# Patient Record
Sex: Female | Born: 1940 | Race: White | Hispanic: No | Smoking: Former smoker
Health system: Southern US, Community
[De-identification: ages and names within clinical notes are randomized; demographics above are authoritative.]

## PROBLEM LIST (undated history)

## (undated) DIAGNOSIS — G35 Multiple sclerosis: Secondary | ICD-10-CM

## (undated) DIAGNOSIS — E039 Hypothyroidism, unspecified: Secondary | ICD-10-CM

## (undated) DIAGNOSIS — I10 Essential (primary) hypertension: Secondary | ICD-10-CM

## (undated) DIAGNOSIS — M1990 Unspecified osteoarthritis, unspecified site: Secondary | ICD-10-CM

## (undated) DIAGNOSIS — N189 Chronic kidney disease, unspecified: Secondary | ICD-10-CM

## (undated) DIAGNOSIS — E119 Type 2 diabetes mellitus without complications: Secondary | ICD-10-CM

## (undated) HISTORY — PX: BACK SURGERY: SHX140

## (undated) HISTORY — PX: COLOSTOMY: SHX63

## (undated) HISTORY — PX: BREAST EXCISIONAL BIOPSY: SUR124

## (undated) HISTORY — PX: CHOLECYSTECTOMY: SHX55

## (undated) HISTORY — PX: ABDOMINAL HYSTERECTOMY: SHX81

---

## 2017-02-17 ENCOUNTER — Emergency Department
Admission: EM | Admit: 2017-02-17 | Discharge: 2017-02-17 | Disposition: A | Discharge status: Home / Self Care | Payer: Medicare Other | Attending: Emergency Medicine | Admitting: Emergency Medicine

## 2017-02-17 DIAGNOSIS — Z87891 Personal history of nicotine dependence: Secondary | ICD-10-CM | POA: Insufficient documentation

## 2017-02-17 DIAGNOSIS — I10 Essential (primary) hypertension: Secondary | ICD-10-CM | POA: Diagnosis not present

## 2017-02-17 DIAGNOSIS — N952 Postmenopausal atrophic vaginitis: Secondary | ICD-10-CM | POA: Diagnosis not present

## 2017-02-17 DIAGNOSIS — N939 Abnormal uterine and vaginal bleeding, unspecified: Secondary | ICD-10-CM | POA: Diagnosis present

## 2017-02-17 DIAGNOSIS — E119 Type 2 diabetes mellitus without complications: Secondary | ICD-10-CM | POA: Insufficient documentation

## 2017-02-17 HISTORY — DX: Type 2 diabetes mellitus without complications: E11.9

## 2017-02-17 HISTORY — DX: Essential (primary) hypertension: I10

## 2017-02-17 LAB — BASIC METABOLIC PANEL
ANION GAP: 9 (ref 5–15)
BUN: 31 mg/dL — ABNORMAL HIGH (ref 6–20)
CO2: 28 mmol/L (ref 22–32)
Calcium: 10.1 mg/dL (ref 8.9–10.3)
Chloride: 101 mmol/L (ref 101–111)
Creatinine, Ser: 1.58 mg/dL — ABNORMAL HIGH (ref 0.44–1.00)
GFR calc non Af Amer: 31 mL/min — ABNORMAL LOW (ref >60)
GFR, EST AFRICAN AMERICAN: 36 mL/min — AB (ref >60)
Glucose, Bld: 163 mg/dL — ABNORMAL HIGH (ref 65–99)
POTASSIUM: 4.4 mmol/L (ref 3.5–5.1)
SODIUM: 138 mmol/L (ref 135–145)

## 2017-02-17 LAB — CBC
HCT: 36.5 % (ref 35.0–47.0)
HEMOGLOBIN: 12.8 g/dL (ref 12.0–16.0)
MCH: 30.5 pg (ref 26.0–34.0)
MCHC: 35.1 g/dL (ref 32.0–36.0)
MCV: 86.8 fL (ref 80.0–100.0)
PLATELETS: 189 10*3/uL (ref 150–440)
RBC: 4.21 MIL/uL (ref 3.80–5.20)
RDW: 12.3 % (ref 11.5–14.5)
WBC: 10.4 10*3/uL (ref 3.6–11.0)

## 2017-02-17 LAB — TYPE AND SCREEN
ABO/RH(D): B POS
ANTIBODY SCREEN: NEGATIVE

## 2017-02-17 LAB — LIPASE, BLOOD: LIPASE: 55 U/L — AB (ref 11–51)

## 2017-02-17 NOTE — Discharge Instructions (Signed)
Please keep your follow-up appointment with your primary care physician on October 1 as scheduled and return to the emergency department for any concerns.  It was a pleasure to take care of you today, and thank you for coming to our emergency department.  If you have any questions or concerns before leaving please ask the nurse to grab me and I'm more than happy to go through your aftercare instructions again.  If you were prescribed any opioid pain medication today such as Norco, Vicodin, Percocet, morphine, hydrocodone, or oxycodone please make sure you do not drive when you are taking this medication as it can alter your ability to drive safely.  If you have any concerns once you are home that you are not improving or are in fact getting worse before you can make it to your follow-up appointment, please do not hesitate to call 911 and come back for further evaluation.  Merrily BrittleNeil Cheney Ewart, MD  Results for orders placed or performed during the hospital encounter of 02/17/17  CBC  Result Value Ref Range   WBC 10.4 3.6 - 11.0 K/uL   RBC 4.21 3.80 - 5.20 MIL/uL   Hemoglobin 12.8 12.0 - 16.0 g/dL   HCT 16.136.5 09.635.0 - 04.547.0 %   MCV 86.8 80.0 - 100.0 fL   MCH 30.5 26.0 - 34.0 pg   MCHC 35.1 32.0 - 36.0 g/dL   RDW 40.912.3 81.111.5 - 91.414.5 %   Platelets 189 150 - 440 K/uL  Basic metabolic panel  Result Value Ref Range   Sodium 138 135 - 145 mmol/L   Potassium 4.4 3.5 - 5.1 mmol/L   Chloride 101 101 - 111 mmol/L   CO2 28 22 - 32 mmol/L   Glucose, Bld 163 (H) 65 - 99 mg/dL   BUN 31 (H) 6 - 20 mg/dL   Creatinine, Ser 7.821.58 (H) 0.44 - 1.00 mg/dL   Calcium 95.610.1 8.9 - 21.310.3 mg/dL   GFR calc non Af Amer 31 (L) >60 mL/min   GFR calc Af Amer 36 (L) >60 mL/min   Anion gap 9 5 - 15  Lipase, blood  Result Value Ref Range   Lipase 55 (H) 11 - 51 U/L  Type and screen [facility]  Result Value Ref Range   ABO/RH(D) B POS    Antibody Screen NEG    Sample Expiration 02/20/2017

## 2017-02-17 NOTE — ED Notes (Signed)
Assessment performed by MD with RN at bedside. No bleeding visualized.

## 2017-02-17 NOTE — ED Provider Notes (Signed)
[facility] Emergency Department Provider Note  ____________________________________________   First MD Initiated Contact with Patient 02/17/17 1931     (approximate)  I have reviewed the triage vital signs and the nursing notes.   HISTORY  Chief Complaint Vaginal Bleeding    HPI Brianna Mora is a 76 y.o. female who comes to the emergency department with 1-2 days of intermittent vaginal bleeding. She had another episode several weeks ago that resolved. She noticed it when she was on the bathroom wiping her vagina after urinating. The toilet paper had blood-tinged and a small amount of clot. No hematochezia. She has a remote abdominal hysterectomy in 1987. No fevers or chills. She has not seen an OB gynecologist or had a pelvic exam in many years. Nothing in particular seems to make the spotting come on or go.She has not had sexual intercourse in 15 years.   Past Medical History:  Diagnosis Date  . Diabetes mellitus without complication (HCC)   . Hypertension     There are no active problems to display for this patient.   Past Surgical History:  Procedure Laterality Date  . ABDOMINAL HYSTERECTOMY    . BACK SURGERY    . CHOLECYSTECTOMY    . COLOSTOMY      Prior to Admission medications   Not on File    Allergies Penicillins  History reviewed. No pertinent family history.  Social History Social History  Substance Use Topics  . Smoking status: Former Games developer  . Smokeless tobacco: Not on file  . Alcohol use No    Review of Systems Constitutional: No fever/chills Eyes: No visual changes. ENT: No sore throat. Cardiovascular: Denies chest pain. Respiratory: Denies shortness of breath. Gastrointestinal: No abdominal pain.  No nausea, no vomiting.  No diarrhea.  No constipation. Genitourinary: Negative for dysuria. Musculoskeletal: Negative for back pain. Skin: Negative for rash. Neurological: Negative for headaches, focal weakness or  numbness.   ____________________________________________   PHYSICAL EXAM:  VITAL SIGNS: ED Triage Vitals  Enc Vitals Group     BP 02/17/17 1820 (!) 149/67     Pulse Rate 02/17/17 1820 80     Resp 02/17/17 1820 18     Temp 02/17/17 1820 98.5 F (36.9 C)     Temp Source 02/17/17 1820 Oral     SpO2 02/17/17 1820 99 %     Weight 02/17/17 1821 183 lb (83 kg)     Height 02/17/17 1821  (1.676 m)     Head Circumference --      Peak Flow --      Pain Score 02/17/17 1820 0     Pain Loc --      Pain Edu? --      Excl. in GC? --     Constitutional: Alert and oriented 4 pleasant cooperative speaks in full clear sentences no diaphoresis Eyes: PERRL EOMI. Head: Atraumatic. Nose: No congestion/rhinnorhea. Mouth/Throat: No trismus Neck: No stridor.   Cardiovascular: Normal rate, regular rhythm. Grossly normal heart sounds.  Good peripheral circulation. Respiratory: Normal respiratory effort.  No retractions. Lungs CTAB and moving good air Gastrointestinal: Soft nontender pelvic exam chaperoned by female nurse Clinton Sawyer: normal external exam vaginal atrophy noted in the vault with mucosal irritation and slight bleeding. Musculoskeletal: No lower extremity edema   Neurologic:  Normal speech and language. No gross focal neurologic deficits are appreciated. Skin:  Skin is warm, dry and intact. No rash noted. Psychiatric: Mood and affect are normal. Speech and behavior are  normal.    ____________________________________________   DIFFERENTIAL includes but not limited to  Vaginal laceration, cervical cancer, vaginal atrophy, hematochezia ____________________________________________   LABS (all labs ordered are listed, but only abnormal results are displayed)  Labs Reviewed  BASIC METABOLIC PANEL - Abnormal; Notable for the following:       Result Value   Glucose, Bld 163 (*)    BUN 31 (*)    Creatinine, Ser 1.58 (*)    GFR calc non Af Amer 31 (*)    GFR calc Af Amer 36 (*)      All other components within normal limits  LIPASE, BLOOD - Abnormal; Notable for the following:    Lipase 55 (*)    All other components within normal limits  CBC  TYPE AND SCREEN    Not anemic __________________________________________  EKG   ____________________________________________  RADIOLOGY   ____________________________________________   PROCEDURES  Procedure(s) performed: no  Procedures  Critical Care performed: no  Observation: no ____________________________________________   INITIAL IMPRESSION / ASSESSMENT AND PLAN / ED COURSE  Pertinent labs & imaging results that were available during my care of the patient were reviewed by me and considered in my medical decision making (see chart for details).  The patient is hemodynamically stable and well appearing. Of most concern with postmenopausal vaginal bleeding cervical cancer, however I did not visualize any masses. I did see several abrasions and tears within her vaginal vault although she is not actively bleeding. This may very well be secondary to vaginal atrophy and a postmenopausal state. Regardless I have recommended she follow up with her primary care physician for reevaluation and further evaluation of her cervix. She is discharged home in improved condition.      ____________________________________________   FINAL CLINICAL IMPRESSION(S) / ED DIAGNOSES  Final diagnoses:  Vaginal atrophy      NEW MEDICATIONS STARTED DURING THIS VISIT:  There are no discharge medications for this patient.    Note:  This document was prepared using Dragon voice recognition software and may include unintentional dictation errors.     Merrily Brittleifenbark, Jorgia Manthei, MD 02/18/17 2209

## 2017-02-17 NOTE — ED Triage Notes (Signed)
Pt arrives with c/o of vaginal bleeding. Had hysterectomy in 1987. States had vaginal bleeding while travelling in [place] a few weeks ago, went away, states came back today. States bright red with clots. Alert, oriented, ambulatory. Does NOT appear pale.

## 2017-04-16 ENCOUNTER — Emergency Department
Admission: EM | Admit: 2017-04-16 | Discharge: 2017-04-16 | Disposition: A | Discharge status: Home / Self Care | Payer: Medicare Other | Attending: Emergency Medicine | Admitting: Emergency Medicine

## 2017-04-16 ENCOUNTER — Encounter: Payer: Self-pay | Admitting: *Deleted

## 2017-04-16 DIAGNOSIS — I10 Essential (primary) hypertension: Secondary | ICD-10-CM | POA: Insufficient documentation

## 2017-04-16 DIAGNOSIS — Z87891 Personal history of nicotine dependence: Secondary | ICD-10-CM | POA: Diagnosis not present

## 2017-04-16 DIAGNOSIS — E119 Type 2 diabetes mellitus without complications: Secondary | ICD-10-CM | POA: Diagnosis not present

## 2017-04-16 DIAGNOSIS — L03031 Cellulitis of right toe: Secondary | ICD-10-CM | POA: Diagnosis not present

## 2017-04-16 DIAGNOSIS — M79671 Pain in right foot: Secondary | ICD-10-CM | POA: Diagnosis present

## 2017-04-16 LAB — CBC
HEMATOCRIT: 38.9 % (ref 35.0–47.0)
HEMOGLOBIN: 13.2 g/dL (ref 12.0–16.0)
MCH: 30.2 pg (ref 26.0–34.0)
MCHC: 33.9 g/dL (ref 32.0–36.0)
MCV: 89 fL (ref 80.0–100.0)
Platelets: 172 10*3/uL (ref 150–440)
RBC: 4.37 MIL/uL (ref 3.80–5.20)
RDW: 12.7 % (ref 11.5–14.5)
WBC: 9 10*3/uL (ref 3.6–11.0)

## 2017-04-16 LAB — COMPREHENSIVE METABOLIC PANEL
ALBUMIN: 4.2 g/dL (ref 3.5–5.0)
ALK PHOS: 75 U/L (ref 38–126)
ALT: 15 U/L (ref 14–54)
AST: 24 U/L (ref 15–41)
Anion gap: 10 (ref 5–15)
BILIRUBIN TOTAL: 0.8 mg/dL (ref 0.3–1.2)
BUN: 31 mg/dL — AB (ref 6–20)
CALCIUM: 9.5 mg/dL (ref 8.9–10.3)
CO2: 26 mmol/L (ref 22–32)
Chloride: 97 mmol/L — ABNORMAL LOW (ref 101–111)
Creatinine, Ser: 1.38 mg/dL — ABNORMAL HIGH (ref 0.44–1.00)
GFR calc Af Amer: 42 mL/min — ABNORMAL LOW (ref >60)
GFR calc non Af Amer: 36 mL/min — ABNORMAL LOW (ref >60)
GLUCOSE: 215 mg/dL — AB (ref 65–99)
Potassium: 4.1 mmol/L (ref 3.5–5.1)
SODIUM: 133 mmol/L — AB (ref 135–145)
TOTAL PROTEIN: 7.5 g/dL (ref 6.5–8.1)

## 2017-04-16 MED ORDER — CLINDAMYCIN HCL 300 MG PO CAPS: 300.0000 mg | ORAL_CAPSULE | Freq: Four times a day (QID) | ORAL | 0 refills | Status: DC

## 2017-04-16 MED ORDER — HYDROCODONE-ACETAMINOPHEN 5-325 MG PO TABS: 1.0000 | ORAL_TABLET | Freq: Four times a day (QID) | ORAL | 0 refills | Status: DC | PRN

## 2017-04-16 MED ORDER — CLINDAMYCIN PHOSPHATE 600 MG/4ML IJ SOLN
600.0000 mg | Freq: Once | INTRAMUSCULAR | Status: AC
Start: 2017-04-16 — End: 2017-04-16
  Administered 2017-04-16: 600 mg via INTRAMUSCULAR
  Filled 2017-04-16: qty 4

## 2017-04-16 MED ORDER — OXYCODONE-ACETAMINOPHEN 5-325 MG PO TABS
1.0000 | ORAL_TABLET | Freq: Once | ORAL | Status: AC
  Administered 2017-04-16: 1 via ORAL
  Filled 2017-04-16: qty 1

## 2017-04-16 MED ORDER — CLINDAMYCIN HCL 300 MG PO CAPS: 300.0000 mg | ORAL_CAPSULE | Freq: Four times a day (QID) | ORAL | 0 refills | Status: AC

## 2017-04-16 NOTE — ED Notes (Signed)
Pt right big toe starrted about 2 weeks ago and last night was intolerable. Pt could not sleep with pain in the toe, to touch anything, she was unable to wear shoe today. "discoloration' noted in her left foot. Awaiting EDP.

## 2017-04-16 NOTE — ED Notes (Signed)
Called pharmacy to send cleocin

## 2017-04-16 NOTE — ED Provider Notes (Signed)
[facility] Emergency Department Provider Note   ____________________________________________   I have reviewed the triage vital signs and the nursing notes.   HISTORY  Chief Complaint Foot Pain    HPI Brianna Mora is a 76 y.o. female presents emergency department with right great toe a distal foot pain, erythema and swelling that developed 2 days ago.  Patient denied any recent traumatic injury and is diabetic.  Patient noted mild pain along the right great toe followed by erythema and swelling with significant worsening of pain.  Patient denies any past history of cellulitis or diabetic foot wounds of the lower extremities.  Patient denies any associated fevers, chills, nausea, vomiting or headache. Patient denies vision changes, chest pain, chest tightness, shortness of breath or abdominal pain.  Past Medical History:  Diagnosis Date  . Diabetes mellitus without complication (HCC)   . Hypertension     There are no active problems to display for this patient.   Past Surgical History:  Procedure Laterality Date  . ABDOMINAL HYSTERECTOMY    . BACK SURGERY    . CHOLECYSTECTOMY    . COLOSTOMY      Prior to Admission medications   Medication Sig Start Date End Date Taking? Authorizing Provider  clindamycin (CLEOCIN) 300 MG capsule Take 1 capsule (300 mg total) 4 (four) times daily for 10 days by mouth. 04/16/17 04/26/17  Leonda Cristo M, PA-C  HYDROcodone-acetaminophen (NORCO/VICODIN) 5-325 MG tablet Take 1 tablet every 6 (six) hours as needed by mouth for moderate pain. 04/16/17   Joseantonio Dittmar M, PA-C    Allergies Penicillins  History reviewed. No pertinent family history.  Social History Social History   Tobacco Use  . Smoking status: Former Smoker  Substance Use Topics  . Alcohol use: No  . Drug use: Not on file    Review of Systems Constitutional: Negative for fever/chills Cardiovascular: Denies chest pain. Respiratory: Denies  shortness of breath. Musculoskeletal: Right great toe pain Skin: Negative for rash.  Right great toe and distal foot erythema and swelling. Neurological: Negative for headaches. ____________________________________________   PHYSICAL EXAM:  VITAL SIGNS: ED Triage Vitals  Enc Vitals Group     BP 04/16/17 1115 (!) 140/43     Pulse Rate 04/16/17 1115 67     Resp 04/16/17 1115 16     Temp 04/16/17 1115 (!) 97.5 F (36.4 C)     Temp Source 04/16/17 1115 Oral     SpO2 04/16/17 1115 100 %     Weight 04/16/17 1115 178 lb (80.7 kg)     Height 04/16/17 1115 5\' 6"  (1.676 m)     Head Circumference --      Peak Flow --      Pain Score 04/16/17 1118 0     Pain Loc --      Pain Edu? --      Excl. in GC? --     Constitutional: Alert and oriented. Well appearing and in no acute distress.  Head: Normocephalic and atraumatic. Cardiovascular: Normal rate, regular rhythm.  Respiratory: Normal respiratory effort without tachypnea or retractions.  Musculoskeletal: Right foot and ankle range of motion, sensation intact. Neurologic: Normal speech and language.  Skin:  Skin is warm, dry and intact. No rash noted.  Right great toe and distal foot erythematous with swelling without induration.  No open wound noted.  No drainage noted. Psychiatric: Mood and affect are normal. Speech and behavior are normal. Patient exhibits appropriate insight and judgement.  ____________________________________________  LABS (all labs ordered are listed, but only abnormal results are displayed)  Labs Reviewed  COMPREHENSIVE METABOLIC PANEL - Abnormal; Notable for the following components:      Result Value   Sodium 133 (*)    Chloride 97 (*)    Glucose, Bld 215 (*)    BUN 31 (*)    Creatinine, Ser 1.38 (*)    GFR calc non Af Amer 36 (*)    GFR calc Af Amer 42 (*)    All other components within normal limits  CBC    ____________________________________________  EKG None ____________________________________________  RADIOLOGY None ____________________________________________   PROCEDURES  Procedure(s) performed: no    Critical Care performed: no ____________________________________________   INITIAL IMPRESSION / ASSESSMENT AND PLAN / ED COURSE  Pertinent labs & imaging results that were available during my care of the patient were reviewed by me and considered in my medical decision making (see chart for details).  Patient presents to emergency department with right great toe a distal foot pain, erythema and swelling that developed 2 days ago. History, physical exam findings and labs are consistent with cellulitis. Patient noted decrease pain following oxycodone given during the course of care in the emergency department.  Antibiotic, clindamycin, initiated during course of care in the emergency department.  Patient will be prescribed clindamycin for continued antibiotic regimen on an outpatient basis.  Patient advised to follow up with PCP as needed or return to the emergency department if symptoms return or worsen. Patient informed of clinical course, understand medical decision-making process, and agree with plan.  ____________________________________________   FINAL CLINICAL IMPRESSION(S) / ED DIAGNOSES  Final diagnoses:  Right foot pain  Cellulitis of toe of right foot       NEW MEDICATIONS STARTED DURING THIS VISIT:  This SmartLink is deprecated. Use AVSMEDLIST instead to display the medication list for a patient.   Note:  This document was prepared using Dragon voice recognition software and may include unintentional dictation errors.   Clois ComberLittle, Etai Copado M, PA-C 04/16/17 1448    Jahnai Slingerland, Jordan Likesraci M, PA-C 04/16/17 1559    Minna AntisPaduchowski, Kevin, MD 04/17/17 1340

## 2017-04-16 NOTE — ED Notes (Signed)
Pharmacy emailed to send cleocin

## 2017-04-16 NOTE — Discharge Instructions (Signed)
Take medication as prescribed.  Return to emergency department if symptoms worsen. Follow-up with Podiatry as soon as you are able to schedule appointment.

## 2017-04-16 NOTE — ED Triage Notes (Signed)
States 2 days ago she felt some pain in her right foot, states since then the foot has become red and swollen, also states some "discoloration" to her left foot, swelling noted bilaterally to feet, pulses dopplered in both feet

## 2017-05-09 ENCOUNTER — Other Ambulatory Visit: Payer: Self-pay | Admitting: Internal Medicine

## 2017-05-09 DIAGNOSIS — Z1231 Encounter for screening mammogram for malignant neoplasm of breast: Secondary | ICD-10-CM

## 2017-05-14 ENCOUNTER — Other Ambulatory Visit: Payer: Self-pay | Admitting: Internal Medicine

## 2017-05-14 DIAGNOSIS — N183 Chronic kidney disease, stage 3 unspecified: Secondary | ICD-10-CM

## 2017-05-20 ENCOUNTER — Ambulatory Visit
Admission: RE | Admit: 2017-05-20 | Discharge: 2017-05-20 | Disposition: A | Discharge status: Home / Self Care | Payer: Medicare Other | Source: Ambulatory Visit | Attending: Internal Medicine | Admitting: Internal Medicine

## 2017-05-20 DIAGNOSIS — N183 Chronic kidney disease, stage 3 unspecified: Secondary | ICD-10-CM

## 2017-05-20 DIAGNOSIS — N281 Cyst of kidney, acquired: Secondary | ICD-10-CM | POA: Insufficient documentation

## 2017-06-05 ENCOUNTER — Ambulatory Visit
Admission: RE | Admit: 2017-06-05 | Discharge: 2017-06-05 | Disposition: A | Discharge status: Home / Self Care | Payer: Medicare Other | Source: Ambulatory Visit | Attending: Internal Medicine | Admitting: Internal Medicine

## 2017-06-05 DIAGNOSIS — Z1231 Encounter for screening mammogram for malignant neoplasm of breast: Secondary | ICD-10-CM | POA: Insufficient documentation

## 2017-12-30 ENCOUNTER — Other Ambulatory Visit: Payer: Self-pay

## 2017-12-30 ENCOUNTER — Emergency Department
Admission: EM | Admit: 2017-12-30 | Discharge: 2017-12-31 | Disposition: A | Discharge status: Home / Self Care | Payer: Medicare Other | Attending: Student in an Organized Health Care Education/Training Program | Admitting: Student in an Organized Health Care Education/Training Program

## 2017-12-30 ENCOUNTER — Emergency Department: Payer: Medicare Other

## 2017-12-30 DIAGNOSIS — Z79899 Other long term (current) drug therapy: Secondary | ICD-10-CM | POA: Diagnosis not present

## 2017-12-30 DIAGNOSIS — I10 Essential (primary) hypertension: Secondary | ICD-10-CM

## 2017-12-30 DIAGNOSIS — Z7984 Long term (current) use of oral hypoglycemic drugs: Secondary | ICD-10-CM | POA: Diagnosis not present

## 2017-12-30 DIAGNOSIS — Z7982 Long term (current) use of aspirin: Secondary | ICD-10-CM | POA: Insufficient documentation

## 2017-12-30 DIAGNOSIS — R531 Weakness: Secondary | ICD-10-CM | POA: Diagnosis not present

## 2017-12-30 DIAGNOSIS — R42 Dizziness and giddiness: Secondary | ICD-10-CM | POA: Diagnosis not present

## 2017-12-30 DIAGNOSIS — Z87891 Personal history of nicotine dependence: Secondary | ICD-10-CM | POA: Insufficient documentation

## 2017-12-30 DIAGNOSIS — R519 Headache, unspecified: Secondary | ICD-10-CM

## 2017-12-30 DIAGNOSIS — R51 Headache: Secondary | ICD-10-CM | POA: Diagnosis not present

## 2017-12-30 DIAGNOSIS — E119 Type 2 diabetes mellitus without complications: Secondary | ICD-10-CM | POA: Diagnosis not present

## 2017-12-30 DIAGNOSIS — R110 Nausea: Secondary | ICD-10-CM | POA: Insufficient documentation

## 2017-12-30 LAB — TROPONIN I: Troponin I: <0.03 ng/mL (ref <0.03)

## 2017-12-30 LAB — COMPREHENSIVE METABOLIC PANEL
ALBUMIN: 4.7 g/dL (ref 3.5–5.0)
ALT: 16 U/L (ref 0–44)
ANION GAP: 8 (ref 5–15)
AST: 31 U/L (ref 15–41)
Alkaline Phosphatase: 87 U/L (ref 38–126)
BUN: 21 mg/dL (ref 8–23)
CHLORIDE: 105 mmol/L (ref 98–111)
CO2: 29 mmol/L (ref 22–32)
Calcium: 10.1 mg/dL (ref 8.9–10.3)
Creatinine, Ser: 1.09 mg/dL — ABNORMAL HIGH (ref 0.44–1.00)
GFR calc non Af Amer: 48 mL/min — ABNORMAL LOW (ref >60)
GFR, EST AFRICAN AMERICAN: 56 mL/min — AB (ref >60)
GLUCOSE: 119 mg/dL — AB (ref 70–99)
Potassium: 4.1 mmol/L (ref 3.5–5.1)
Sodium: 142 mmol/L (ref 135–145)
Total Bilirubin: 0.8 mg/dL (ref 0.3–1.2)
Total Protein: 8.2 g/dL — ABNORMAL HIGH (ref 6.5–8.1)

## 2017-12-30 LAB — URINALYSIS, COMPLETE (UACMP) WITH MICROSCOPIC
BACTERIA UA: NONE SEEN
BILIRUBIN URINE: NEGATIVE
Glucose, UA: NEGATIVE mg/dL
HGB URINE DIPSTICK: NEGATIVE
KETONES UR: NEGATIVE mg/dL
Leukocytes, UA: NEGATIVE
NITRITE: NEGATIVE
Protein, ur: NEGATIVE mg/dL
Specific Gravity, Urine: 1.004 — ABNORMAL LOW (ref 1.005–1.030)
Squamous Epithelial / LPF: NONE SEEN (ref 0–5)
pH: 7 (ref 5.0–8.0)

## 2017-12-30 LAB — CBC
HCT: 39.7 % (ref 35.0–47.0)
HEMOGLOBIN: 13.9 g/dL (ref 12.0–16.0)
MCH: 31.3 pg (ref 26.0–34.0)
MCHC: 34.9 g/dL (ref 32.0–36.0)
MCV: 89.7 fL (ref 80.0–100.0)
PLATELETS: 156 10*3/uL (ref 150–440)
RBC: 4.42 MIL/uL (ref 3.80–5.20)
RDW: 12.8 % (ref 11.5–14.5)
WBC: 8.3 10*3/uL (ref 3.6–11.0)

## 2017-12-30 MED ORDER — AMLODIPINE BESYLATE 5 MG PO TABS
5.0000 mg | ORAL_TABLET | Freq: Once | ORAL | Status: AC
  Administered 2017-12-30: 5 mg via ORAL
  Filled 2017-12-30: qty 1

## 2017-12-30 MED ORDER — AMLODIPINE BESYLATE 5 MG PO TABS: 5.0000 mg | ORAL_TABLET | Freq: Every day | ORAL | 0 refills | Status: DC

## 2017-12-30 MED ORDER — HYDRALAZINE HCL 50 MG PO TABS
50.0000 mg | ORAL_TABLET | Freq: Once | ORAL | Status: AC
  Administered 2017-12-30: 50 mg via ORAL
  Filled 2017-12-30: qty 1

## 2017-12-30 MED ORDER — ACETAMINOPHEN 500 MG PO TABS
1000.0000 mg | ORAL_TABLET | Freq: Once | ORAL | Status: AC
  Administered 2017-12-30: 1000 mg via ORAL
  Filled 2017-12-30: qty 2

## 2017-12-30 NOTE — ED Notes (Signed)
While in triage pt starting shaking all over, states no hx of the same and "feels like I am loosing control of my body".

## 2017-12-30 NOTE — ED Provider Notes (Addendum)
[facility] Emergency Department Provider Note    First MD Initiated Contact with Patient 12/30/17 2158     (approximate)  I have reviewed the triage vital signs and the nursing notes.   HISTORY  Chief Complaint Weakness    HPI Brianna Mora is a 77 y.o. female history of diabetes and hypertension presents to the ER with chief complaint of elevated blood pressure associated with headache and lightheadedness throughout the day.  Patient said she was also feeling nauseated.  Patient states that she woke up this morning took her blood pressure noticed it was elevated.  Was having mild headache this morning when she woke up.  Checked her blood pressure several times throughout the day and it continuously became more more elevated.  Eyes any chest pain or shortness of breath with this.  No numbness or tingling.  No blurry vision.  On the way to the ER she started feeling worsening nausea started having shaking spell that was witnessed by family but did not have a syncopal episode.  Patient very concerned that her blood pressures been this elevated.    Past Medical History:  Diagnosis Date  . Diabetes mellitus without complication (HCC)   . Hypertension    Family History  Problem Relation Age of Onset  . Breast cancer Neg Hx    Past Surgical History:  Procedure Laterality Date  . ABDOMINAL HYSTERECTOMY    . BACK SURGERY    . BREAST BIOPSY Left    neg  . CHOLECYSTECTOMY    . COLOSTOMY     There are no active problems to display for this patient.     Prior to Admission medications   Medication Sig Start Date End Date Taking? Authorizing Provider  aspirin EC 81 MG tablet Take 81 mg by mouth daily.   Yes [provider]  furosemide (LASIX) 20 MG tablet TAKE 1 TABLET BY MOUTH AS NEEDED 11/25/17  Yes [provider]  gabapentin (NEURONTIN) 100 MG capsule Take 100 mg by mouth as needed.   Yes [provider]  glimepiride (AMARYL)  2 MG tablet TAKE 1 TABLET (2 MG TOTAL) BY MOUTH DAILY WITH BREAKFAST 11/13/17  Yes [provider]  hydrALAZINE (APRESOLINE) 25 MG tablet TAKE 1 TABLET (25 MG TOTAL) BY MOUTH 3 (THREE) TIMES DAILY **DISCONTINUE AMLODIPINE** 12/08/17  Yes [provider]  HYDROcodone-acetaminophen (NORCO/VICODIN) 5-325 MG tablet Take 1 tablet every 6 (six) hours as needed by mouth for moderate pain. 04/16/17  Yes Little, Traci M, PA-C  levothyroxine (SYNTHROID, LEVOTHROID) 100 MCG tablet Take 100 mcg by mouth daily. 11/17/17  Yes [provider]  losartan (COZAAR) 100 MG tablet Take 100 mg by mouth at bedtime.  11/12/17  Yes [provider]  Melatonin 10 MG TABS Take 1 tablet by mouth at bedtime.   Yes [provider]  metoprolol tartrate (LOPRESSOR) 50 MG tablet Take 50 mg by mouth 2 (two) times daily. 11/26/17  Yes [provider]  rosuvastatin (CRESTOR) 10 MG tablet Take 10 mg by mouth at bedtime.  12/20/17  Yes [provider]  traMADol (ULTRAM) 50 MG tablet TAKE 1 TABLET (50 MG TOTAL) BY MOUTH 3 (THREE) TIMES DAILY AS NEEDED FOR PAIN 10/02/17  Yes [provider]  traZODone (DESYREL) 100 MG tablet TAKE 1 TABLET BY MOUTH EVERY DAY AT NIGHT 12/23/17  Yes [provider]  amLODipine (NORVASC) 5 MG tablet Take 1 tablet (5 mg total) by mouth daily. 12/30/17 12/30/18  Roxan Hockey,  Luisa HartPatrick, MD  ondansetron (ZOFRAN-ODT) 4 MG disintegrating tablet Take 4 mg by mouth every 8 (eight) hours as needed. for nausea 10/02/17   [provider]    Allergies Penicillins    Social History Social History   Tobacco Use  . Smoking status: Former Smoker  Substance Use Topics  . Alcohol use: No  . Drug use: Not on file    Review of Systems Patient denies headaches, rhinorrhea, blurry vision, numbness, shortness of breath, chest pain, edema, cough, abdominal pain, nausea, vomiting, diarrhea, dysuria, fevers, rashes or hallucinations unless otherwise  stated above in HPI. ____________________________________________   PHYSICAL EXAM:  VITAL SIGNS: Vitals:   12/30/17 2345 12/31/17 0000  BP:  (!) 160/59  Pulse: 74 73  Resp: 17 16  Temp:    SpO2: 97% 96%    Constitutional: Alert and oriented.  Eyes: Conjunctivae are normal.  Head: Atraumatic. Nose: No congestion/rhinnorhea. Mouth/Throat: Mucous membranes are moist.   Neck: No stridor. Painless ROM.  Cardiovascular: Normal rate, regular rhythm. Grossly normal heart sounds.  Good peripheral circulation. Respiratory: Normal respiratory effort.  No retractions. Lungs CTAB. Gastrointestinal: Soft and nontender. No distention. No abdominal bruits. Colostomy in place. No CVA tenderness. Genitourinary:  Musculoskeletal: No lower extremity tenderness nor edema.  No joint effusions. Neurologic:  CN- intact.  No facial droop, Normal FNF.  Normal heel to shin.  Sensation intact bilaterally. Normal speech and language. No gross focal neurologic deficits are appreciated. No gait instability. Skin:  Skin is warm, dry and intact. No rash noted. Psychiatric: Mood and affect are normal. Speech and behavior are normal.  ____________________________________________   LABS (all labs ordered are listed, but only abnormal results are displayed)  Results for orders placed or performed during the hospital encounter of 12/30/17 (from the past 24 hour(s))  CBC     Status: None   Collection Time: 12/30/17 10:00 PM  Result Value Ref Range   WBC 8.3 3.6 - 11.0 K/uL   RBC 4.42 3.80 - 5.20 MIL/uL   Hemoglobin 13.9 12.0 - 16.0 g/dL   HCT 40.939.7 81.135.0 - 91.447.0 %   MCV 89.7 80.0 - 100.0 fL   MCH 31.3 26.0 - 34.0 pg   MCHC 34.9 32.0 - 36.0 g/dL   RDW 78.212.8 95.611.5 - 21.314.5 %   Platelets 156 150 - 440 K/uL  Comprehensive metabolic panel     Status: Abnormal   Collection Time: 12/30/17 10:00 PM  Result Value Ref Range   Sodium 142 135 - 145 mmol/L   Potassium 4.1 3.5 - 5.1 mmol/L   Chloride 105 98 - 111 mmol/L     CO2 29 22 - 32 mmol/L   Glucose, Bld 119 (H) 70 - 99 mg/dL   BUN 21 8 - 23 mg/dL   Creatinine, Ser 0.861.09 (H) 0.44 - 1.00 mg/dL   Calcium 57.810.1 8.9 - 46.910.3 mg/dL   Total Protein 8.2 (H) 6.5 - 8.1 g/dL   Albumin 4.7 3.5 - 5.0 g/dL   AST 31 15 - 41 U/L   ALT 16 0 - 44 U/L   Alkaline Phosphatase 87 38 - 126 U/L   Total Bilirubin 0.8 0.3 - 1.2 mg/dL   GFR calc non Af Amer 48 (L) >60 mL/min   GFR calc Af Amer 56 (L) >60 mL/min   Anion gap 8 5 - 15  Troponin I     Status: None   Collection Time: 12/30/17 10:00 PM  Result Value Ref Range   Troponin I <0.03 <  0.03 ng/mL  Urinalysis, Complete w Microscopic     Status: Abnormal   Collection Time: 12/30/17 10:00 PM  Result Value Ref Range   Color, Urine COLORLESS (A) YELLOW   APPearance CLEAR (A) CLEAR   Specific Gravity, Urine 1.004 (L) 1.005 - 1.030   pH 7.0 5.0 - 8.0   Glucose, UA NEGATIVE NEGATIVE mg/dL   Hgb urine dipstick NEGATIVE NEGATIVE   Bilirubin Urine NEGATIVE NEGATIVE   Ketones, ur NEGATIVE NEGATIVE mg/dL   Protein, ur NEGATIVE NEGATIVE mg/dL   Nitrite NEGATIVE NEGATIVE   Leukocytes, UA NEGATIVE NEGATIVE   RBC / HPF 0-5 0 - 5 RBC/hpf   WBC, UA 0-5 0 - 5 WBC/hpf   Bacteria, UA NONE SEEN NONE SEEN   Squamous Epithelial / LPF NONE SEEN 0 - 5   ____________________________________________  EKG My review and personal interpretation at Time: 21:46   Indication: htn  Rate: 90 Rhythm: normal Axis: normal Other: normal intervals, no stemi ____________________________________________  RADIOLOGY  I personally reviewed all radiographic images ordered to evaluate for the above acute complaints and reviewed radiology reports and findings.  These findings were personally discussed with the patient.  Please see medical record for radiology report.  ____________________________________________   PROCEDURES  Procedure(s) performed:  Procedures    Critical Care performed:  no ____________________________________________   INITIAL IMPRESSION / ASSESSMENT AND PLAN / ED COURSE  Pertinent labs & imaging results that were available during my care of the patient were reviewed by me and considered in my medical decision making (see chart for details).   DDX: htnive, aki, medication noncpmpliance unlikely cva, press, possible anxiety, migraine  Louiza Moor is a 77 y.o. who presents to the ED with Non-distressed patient presenting with concern for elevated BP. Patient is AF,VSS with HTN in ED. Exam as above. Given current presentation have considered the above differential.  CT imaging ordered given her headache to evaluate for bleed.  Not clinically consistent with subarachnoid there is no hemorrhage or mass-effect.  Headache seems more clinically consistent with tension headache had significant improvement with just Tylenol and improving blood pressure.  Not clinically consistent with press.  She has no focal neuro deficits.  EKG is nonischemic.  Troponin is negative.  Blood work shows no evidence of renal insufficiency.  Blood work otherwise reassuring.  No proteinuria on urinalysis.  Chest x-ray shows no active edema.  Patient was given a dose of Norvasc and her hydralazine with some improvement.  Just it does not appear to be consistent with congestive heart failure.  Discussed options for further treatment with patient including no changes in following up with PCP as well as increasing her hydralazine dose and another option of filling and starting Norvasc.  I have recommended the patient start taking Lasix for the next 3 to 4 days based on her leg swelling.  We will touch base with Dr. Thedore Mins in the morning regarding further recommendations.  She has been given a prescription for the Norvasc if this is recommended by her PCP.  Have discussed with the patient and available family all diagnostics and treatments performed thus far and all questions were answered to the best of  my ability. The patient demonstrates understanding and agreement with plan.       As part of my medical decision making, I reviewed the following data within the electronic MEDICAL RECORD NUMBER Nursing notes reviewed and incorporated, Labs reviewed, notes from prior ED visits.    ____________________________________________   FINAL CLINICAL IMPRESSION(S) /  ED DIAGNOSES  Final diagnoses:  Hypertension, unspecified type  Acute nonintractable headache, unspecified headache type      NEW MEDICATIONS STARTED DURING THIS VISIT:  New Prescriptions   AMLODIPINE (NORVASC) 5 MG TABLET    Take 1 tablet (5 mg total) by mouth daily.     Note:  This document was prepared using Dragon voice recognition software and may include unintentional dictation errors.    Willy Eddy, MD 12/30/17 2352    Willy Eddy, MD 12/31/17 [phone]

## 2017-12-30 NOTE — ED Triage Notes (Addendum)
Pt in with co nausea and headache since this am, denies any vomiting or diarrhea. Did check her bp at home and states was elevated, did take her bp meds this am. Pt also co feeling dizzy since this am.

## 2017-12-30 NOTE — Discharge Instructions (Addendum)
Please take your lasix every morning for the next 3-4 days to help with swelling and BP.   As discussed in the emergency department, you may use Tylenol and/or Ibuprofen for headaches. These are "Over the Counter" medications and can be found at most drug stores and grocery stores. Please use the recommended dosing instructions on the bottle/box. Do not exceed the maximum dose for either medications. Please be sure to rest and drink plenty of fluids. Please be sure to call your PCP for a follow-up visit, especially if your headaches persist.  Please call your physician or return to ED if you have: 1. Worsening or change in headaches. 2. Changes in vision. 3. New-onset nausea and vomiting. 4. Numbness, tingling, weakness in your extremities,. 4. Inability to eat or drink adequate amounts of food or liquids. 5. Chest pain, shortness of breath, or difficulty breathing. 6. Neurological changes- dizziness, fainting, loss of function of your arms, legs or other parts of your body. 7. Uncontrolled hypertension. 8. Or any other emergent concerns.

## 2017-12-30 NOTE — ED Notes (Signed)
Patient transported to CT 

## 2018-02-03 ENCOUNTER — Other Ambulatory Visit: Payer: Self-pay | Admitting: Family Medicine

## 2018-02-03 DIAGNOSIS — M5441 Lumbago with sciatica, right side: Secondary | ICD-10-CM

## 2018-02-03 DIAGNOSIS — M5442 Lumbago with sciatica, left side: Principal | ICD-10-CM

## 2018-02-16 ENCOUNTER — Ambulatory Visit
Admission: RE | Admit: 2018-02-16 | Discharge: 2018-02-16 | Disposition: A | Discharge status: Home / Self Care | Payer: Medicare Other | Source: Ambulatory Visit | Attending: Family Medicine | Admitting: Family Medicine

## 2018-02-16 DIAGNOSIS — M5442 Lumbago with sciatica, left side: Principal | ICD-10-CM

## 2018-02-16 DIAGNOSIS — M5441 Lumbago with sciatica, right side: Secondary | ICD-10-CM

## 2018-04-07 ENCOUNTER — Other Ambulatory Visit: Payer: Self-pay | Admitting: Internal Medicine

## 2018-04-07 DIAGNOSIS — Z1231 Encounter for screening mammogram for malignant neoplasm of breast: Secondary | ICD-10-CM

## 2018-06-30 ENCOUNTER — Ambulatory Visit
Admission: RE | Admit: 2018-06-30 | Discharge: 2018-06-30 | Disposition: A | Discharge status: Home / Self Care | Payer: Medicare Other | Source: Ambulatory Visit | Attending: Internal Medicine | Admitting: Internal Medicine

## 2018-06-30 DIAGNOSIS — Z1231 Encounter for screening mammogram for malignant neoplasm of breast: Secondary | ICD-10-CM | POA: Insufficient documentation

## 2019-07-08 ENCOUNTER — Other Ambulatory Visit: Payer: Self-pay | Admitting: Internal Medicine

## 2019-07-08 DIAGNOSIS — Z1231 Encounter for screening mammogram for malignant neoplasm of breast: Secondary | ICD-10-CM

## 2019-08-06 ENCOUNTER — Ambulatory Visit
Admission: RE | Admit: 2019-08-06 | Discharge: 2019-08-06 | Disposition: A | Discharge status: Home / Self Care | Payer: Medicare Other | Source: Ambulatory Visit | Attending: Internal Medicine | Admitting: Internal Medicine

## 2019-08-06 DIAGNOSIS — Z1231 Encounter for screening mammogram for malignant neoplasm of breast: Secondary | ICD-10-CM

## 2019-09-28 ENCOUNTER — Other Ambulatory Visit: Payer: Self-pay | Admitting: Orthopedic Surgery

## 2019-10-06 ENCOUNTER — Other Ambulatory Visit: Payer: Self-pay

## 2019-10-06 ENCOUNTER — Encounter
Admission: RE | Admit: 2019-10-06 | Discharge: 2019-10-06 | Disposition: A | Discharge status: Home / Self Care | Payer: Medicare Other | Source: Ambulatory Visit | Attending: Orthopedic Surgery | Admitting: Orthopedic Surgery

## 2019-10-06 HISTORY — DX: Multiple sclerosis: G35

## 2019-10-06 HISTORY — DX: Unspecified osteoarthritis, unspecified site: M19.90

## 2019-10-06 HISTORY — DX: Chronic kidney disease, unspecified: N18.9

## 2019-10-06 HISTORY — DX: Hypothyroidism, unspecified: E03.9

## 2019-10-06 NOTE — Patient Instructions (Signed)
Your procedure is scheduled on: 10/14/19 Report to DAY SURGERY DEPARTMENT LOCATED ON 2ND FLOOR MEDICAL MALL ENTRANCE. To find out your arrival time please call [phone] between 1PM - 3PM on 10/13/19.  Remember: Instructions that are not followed completely may result in serious medical risk, up to and including death, or upon the discretion of your surgeon and anesthesiologist your surgery may need to be rescheduled.     _X__ 1. Do not eat food after midnight the night before your procedure.                 No gum chewing or hard candies. You may drink clear liquids up to 2 hours                 before you are scheduled to arrive for your surgery- DO not drink clear                 liquids within 2 hours of the start of your surgery.                 Clear Liquids include:  water, apple juice without pulp, clear carbohydrate                 drink such as Clearfast or Gatorade, Black Coffee or Tea (Do not add                 anything to coffee or tea). Diabetics water only  __X__2.  On the morning of surgery brush your teeth with toothpaste and water, you                 may rinse your mouth with mouthwash if you wish.  Do not swallow any              toothpaste of mouthwash.     _X__ 3.  No Alcohol for 24 hours before or after surgery.   _X__ 4.  Do Not Smoke or use e-cigarettes For 24 Hours Prior to Your Surgery.                 Do not use any chewable tobacco products for at least 6 hours prior to                 surgery.  ____  5.  Bring all medications with you on the day of surgery if instructed.   __X__  6.  Notify your doctor if there is any change in your medical condition      (cold, fever, infections).     Do not wear jewelry, make-up, hairpins, clips or nail polish. Do not wear lotions, powders, or perfumes.  Do not shave 48 hours prior to surgery. Men may shave face and neck. Do not bring valuables to the hospital.    [facility] is not responsible for any belongings or  valuables.  Contacts, dentures/partials or body piercings may not be worn into surgery. Bring a case for your contacts, glasses or hearing aids, a denture cup will be supplied. Leave your suitcase in the car. After surgery it may be brought to your room. For patients admitted to the hospital, discharge time is determined by your treatment team.   Patients discharged the day of surgery will not be allowed to drive home.   Please read over the following fact sheets that you were given:   MRSA Information  __X__ Take these medicines the morning of surgery with A SIP OF WATER:  1. gabapentin (NEURONTIN) 100 MG capsule  2. hydrALAZINE (APRESOLINE) 25 MG tablet  3. levothyroxine (SYNTHROID) 125 MCG tablet  4. metoprolol tartrate (LOPRESSOR) 50 MG tablet  5. traMADol (ULTRAM) 50 MG tablet if needed  6.  ____ Fleet Enema (as directed)   __X__ Use CHG Soap/SAGE wipes as directed (Liberal use. Use 1/2 the bottle the night before and the other 1/2 the morning of your surgery)  ____ Use inhalers on the day of surgery  ____ Stop metformin/Janumet/Farxiga 2 days prior to surgery    ____ Take 1/2 of usual insulin dose the night before surgery. No insulin the morning          of surgery.   ____ Stop Blood Thinners Coumadin/Plavix/Xarelto/Pleta/Pradaxa/Eliquis/Effient/Aspirin  on   Or contact your Surgeon, Cardiologist or Medical Doctor regarding  ability to stop your blood thinners  __X__ Stop Anti-inflammatories 7 days before surgery such as Advil, Ibuprofen, Motrin,  BC or Goodies Powder, Naprosyn, Naproxen, Aleve, Aspirin    __X__ Stop all herbal supplements, fish oil or vitamin E until after surgery.    ____ Bring C-Pap to the hospital.    The G2 Gatorade drink should be finished 2 hours prior to your arrival the day of surgery. Review the [employer] instructions.

## 2019-10-07 ENCOUNTER — Encounter
Admission: RE | Admit: 2019-10-07 | Discharge: 2019-10-07 | Disposition: A | Discharge status: Home / Self Care | Payer: Medicare Other | Source: Ambulatory Visit | Attending: Orthopedic Surgery | Admitting: Orthopedic Surgery

## 2019-10-07 ENCOUNTER — Other Ambulatory Visit: Payer: Self-pay

## 2019-10-07 DIAGNOSIS — I10 Essential (primary) hypertension: Secondary | ICD-10-CM | POA: Diagnosis not present

## 2019-10-07 DIAGNOSIS — E118 Type 2 diabetes mellitus with unspecified complications: Secondary | ICD-10-CM | POA: Insufficient documentation

## 2019-10-07 DIAGNOSIS — Z01818 Encounter for other preprocedural examination: Secondary | ICD-10-CM | POA: Diagnosis not present

## 2019-10-07 LAB — BASIC METABOLIC PANEL
Anion gap: 9 (ref 5–15)
BUN: 29 mg/dL — ABNORMAL HIGH (ref 8–23)
CO2: 30 mmol/L (ref 22–32)
Calcium: 10.2 mg/dL (ref 8.9–10.3)
Chloride: 101 mmol/L (ref 98–111)
Creatinine, Ser: 1.54 mg/dL — ABNORMAL HIGH (ref 0.44–1.00)
GFR calc Af Amer: 37 mL/min — ABNORMAL LOW (ref >60)
GFR calc non Af Amer: 32 mL/min — ABNORMAL LOW (ref >60)
Glucose, Bld: 126 mg/dL — ABNORMAL HIGH (ref 70–99)
Potassium: 4.1 mmol/L (ref 3.5–5.1)
Sodium: 140 mmol/L (ref 135–145)

## 2019-10-07 LAB — HEMOGLOBIN: Hemoglobin: 12.7 g/dL (ref 12.0–15.0)

## 2019-10-12 ENCOUNTER — Other Ambulatory Visit
Admission: RE | Admit: 2019-10-12 | Discharge: 2019-10-12 | Disposition: A | Discharge status: Home / Self Care | Payer: Medicare Other | Source: Ambulatory Visit | Attending: Orthopedic Surgery | Admitting: Orthopedic Surgery

## 2019-10-12 ENCOUNTER — Other Ambulatory Visit: Payer: Self-pay

## 2019-10-12 DIAGNOSIS — Z01812 Encounter for preprocedural laboratory examination: Secondary | ICD-10-CM | POA: Insufficient documentation

## 2019-10-12 DIAGNOSIS — Z20822 Contact with and (suspected) exposure to covid-19: Secondary | ICD-10-CM | POA: Insufficient documentation

## 2019-10-13 LAB — SARS CORONAVIRUS 2 (TAT 6-24 HRS): SARS Coronavirus 2: NEGATIVE

## 2019-10-14 ENCOUNTER — Ambulatory Visit: Payer: Medicare Other | Admitting: Anesthesiology

## 2019-10-14 ENCOUNTER — Encounter: Payer: Self-pay | Admitting: Orthopedic Surgery

## 2019-10-14 ENCOUNTER — Other Ambulatory Visit: Payer: Self-pay

## 2019-10-14 ENCOUNTER — Ambulatory Visit
Admission: RE | Admit: 2019-10-14 | Discharge: 2019-10-14 | Disposition: A | Discharge status: Home / Self Care | Payer: Medicare Other | Attending: Orthopedic Surgery | Admitting: Orthopedic Surgery

## 2019-10-14 ENCOUNTER — Encounter
Admission: RE | Disposition: A | Discharge status: Home / Self Care | Payer: Self-pay | Source: Home / Self Care | Attending: Orthopedic Surgery

## 2019-10-14 DIAGNOSIS — Z87891 Personal history of nicotine dependence: Secondary | ICD-10-CM | POA: Insufficient documentation

## 2019-10-14 DIAGNOSIS — M654 Radial styloid tenosynovitis [de Quervain]: Secondary | ICD-10-CM | POA: Diagnosis present

## 2019-10-14 DIAGNOSIS — Z7982 Long term (current) use of aspirin: Secondary | ICD-10-CM | POA: Insufficient documentation

## 2019-10-14 DIAGNOSIS — Z7984 Long term (current) use of oral hypoglycemic drugs: Secondary | ICD-10-CM | POA: Insufficient documentation

## 2019-10-14 DIAGNOSIS — Z79899 Other long term (current) drug therapy: Secondary | ICD-10-CM | POA: Diagnosis not present

## 2019-10-14 DIAGNOSIS — E031 Congenital hypothyroidism without goiter: Secondary | ICD-10-CM | POA: Diagnosis not present

## 2019-10-14 DIAGNOSIS — E119 Type 2 diabetes mellitus without complications: Secondary | ICD-10-CM | POA: Insufficient documentation

## 2019-10-14 DIAGNOSIS — I10 Essential (primary) hypertension: Secondary | ICD-10-CM | POA: Insufficient documentation

## 2019-10-14 HISTORY — PX: DORSAL COMPARTMENT RELEASE: SHX5039

## 2019-10-14 LAB — GLUCOSE, CAPILLARY
Glucose-Capillary: 97 mg/dL (ref 70–99)
Glucose-Capillary: 86 mg/dL (ref 70–99)

## 2019-10-14 SURGERY — RELEASE, FIRST DORSAL COMPARTMENT, HAND
Anesthesia: Choice | Site: Wrist | Laterality: Right

## 2019-10-14 MED ORDER — FAMOTIDINE 20 MG PO TABS
ORAL_TABLET | ORAL | Status: AC
  Administered 2019-10-14: 20 mg via ORAL
  Filled 2019-10-14: qty 1

## 2019-10-14 MED ORDER — ONDANSETRON HCL 4 MG PO TABS: 4.0000 mg | ORAL_TABLET | Freq: Four times a day (QID) | ORAL | Status: DC | PRN

## 2019-10-14 MED ORDER — LIDOCAINE HCL (CARDIAC) PF 100 MG/5ML IV SOSY
PREFILLED_SYRINGE | INTRAVENOUS | Status: DC | PRN
  Administered 2019-10-14: 80 mg via INTRAVENOUS

## 2019-10-14 MED ORDER — HYDROCODONE-ACETAMINOPHEN 5-325 MG PO TABS
1.0000 | ORAL_TABLET | Freq: Once | ORAL | Status: AC
  Administered 2019-10-14: 14:00:00 1 via ORAL

## 2019-10-14 MED ORDER — PROPOFOL 10 MG/ML IV BOLUS
INTRAVENOUS | Status: AC
  Filled 2019-10-14: qty 20

## 2019-10-14 MED ORDER — PHENYLEPHRINE HCL (PRESSORS) 10 MG/ML IV SOLN
INTRAVENOUS | Status: DC | PRN
  Administered 2019-10-14: 100 ug via INTRAVENOUS

## 2019-10-14 MED ORDER — PROPOFOL 10 MG/ML IV BOLUS
INTRAVENOUS | Status: DC | PRN
  Administered 2019-10-14: 130 mg via INTRAVENOUS

## 2019-10-14 MED ORDER — LIDOCAINE HCL (PF) 2 % IJ SOLN
INTRAMUSCULAR | Status: AC
  Filled 2019-10-14: qty 5

## 2019-10-14 MED ORDER — BUPIVACAINE HCL (PF) 0.5 % IJ SOLN
INTRAMUSCULAR | Status: DC | PRN
  Administered 2019-10-14: 5 mL

## 2019-10-14 MED ORDER — ONDANSETRON HCL 4 MG/2ML IJ SOLN: 4.0000 mg | Freq: Four times a day (QID) | INTRAMUSCULAR | Status: DC | PRN

## 2019-10-14 MED ORDER — ONDANSETRON HCL 4 MG/2ML IJ SOLN: 4.0000 mg | Freq: Once | INTRAMUSCULAR | Status: DC | PRN

## 2019-10-14 MED ORDER — DEXAMETHASONE SODIUM PHOSPHATE 10 MG/ML IJ SOLN
INTRAMUSCULAR | Status: DC | PRN
  Administered 2019-10-14: 10 mg via INTRAVENOUS

## 2019-10-14 MED ORDER — METOCLOPRAMIDE HCL 10 MG PO TABS
5.0000 mg | ORAL_TABLET | Freq: Three times a day (TID) | ORAL | Status: DC | PRN
Start: 2019-10-14 — End: 2019-10-14

## 2019-10-14 MED ORDER — ONDANSETRON HCL 4 MG/2ML IJ SOLN
INTRAMUSCULAR | Status: AC
  Filled 2019-10-14: qty 2

## 2019-10-14 MED ORDER — ONDANSETRON HCL 4 MG/2ML IJ SOLN
INTRAMUSCULAR | Status: DC | PRN
  Administered 2019-10-14: 4 mg via INTRAVENOUS

## 2019-10-14 MED ORDER — FENTANYL CITRATE (PF) 100 MCG/2ML IJ SOLN: 25.0000 ug | INTRAMUSCULAR | Status: DC | PRN

## 2019-10-14 MED ORDER — HYDROCODONE-ACETAMINOPHEN 5-325 MG PO TABS
ORAL_TABLET | ORAL | Status: AC
  Filled 2019-10-14: qty 1

## 2019-10-14 MED ORDER — SODIUM CHLORIDE 0.9 % IV SOLN: INTRAVENOUS | Status: DC

## 2019-10-14 MED ORDER — LACTATED RINGERS IV SOLN: INTRAVENOUS | Status: DC | PRN

## 2019-10-14 MED ORDER — SODIUM CHLORIDE 0.9 % IV SOLN
INTRAVENOUS | Status: DC
Start: 2019-10-14 — End: 2019-10-14

## 2019-10-14 MED ORDER — PHENYLEPHRINE HCL (PRESSORS) 10 MG/ML IV SOLN
INTRAVENOUS | Status: AC
  Filled 2019-10-14: qty 1

## 2019-10-14 MED ORDER — DEXAMETHASONE SODIUM PHOSPHATE 10 MG/ML IJ SOLN
INTRAMUSCULAR | Status: AC
  Filled 2019-10-14: qty 1

## 2019-10-14 MED ORDER — FENTANYL CITRATE (PF) 100 MCG/2ML IJ SOLN
INTRAMUSCULAR | Status: AC
  Filled 2019-10-14: qty 2

## 2019-10-14 MED ORDER — BUPIVACAINE HCL (PF) 0.5 % IJ SOLN
INTRAMUSCULAR | Status: AC
  Filled 2019-10-14: qty 30

## 2019-10-14 MED ORDER — METOCLOPRAMIDE HCL 5 MG/ML IJ SOLN: 5.0000 mg | Freq: Three times a day (TID) | INTRAMUSCULAR | Status: DC | PRN

## 2019-10-14 MED ORDER — FENTANYL CITRATE (PF) 100 MCG/2ML IJ SOLN
INTRAMUSCULAR | Status: DC | PRN
  Administered 2019-10-14: 25 ug via INTRAVENOUS
  Administered 2019-10-14: 50 ug via INTRAVENOUS
  Administered 2019-10-14: 25 ug via INTRAVENOUS

## 2019-10-14 MED ORDER — FAMOTIDINE 20 MG PO TABS: 20.0000 mg | ORAL_TABLET | Freq: Once | ORAL | Status: AC

## 2019-10-14 MED ORDER — HYDROCODONE-ACETAMINOPHEN 5-325 MG PO TABS: 1.0000 | ORAL_TABLET | Freq: Four times a day (QID) | ORAL | 0 refills | Status: AC | PRN

## 2019-10-14 SURGICAL SUPPLY — 26 items
BNDG ELASTIC 3X5.8 VLCR NS LF (GAUZE/BANDAGES/DRESSINGS) ×3 IMPLANT
BNDG ELASTIC 3X5.8 VLCR STR LF (GAUZE/BANDAGES/DRESSINGS) ×3 IMPLANT
CANISTER SUCT 1200ML W/VALVE (MISCELLANEOUS) ×3 IMPLANT
CAST PADDING 3X4FT ST 30246 (SOFTGOODS) ×2
CHLORAPREP W/TINT 26 (MISCELLANEOUS) ×3 IMPLANT
CUFF TOURN SGL QUICK 18X4 (TOURNIQUET CUFF) ×3 IMPLANT
ELECT REM PT RETURN 9FT ADLT (ELECTROSURGICAL) ×3
ELECTRODE REM PT RTRN 9FT ADLT (ELECTROSURGICAL) ×1 IMPLANT
GAUZE SPONGE 4X4 12PLY STRL (GAUZE/BANDAGES/DRESSINGS) ×3 IMPLANT
GAUZE XEROFORM 1X8 LF (GAUZE/BANDAGES/DRESSINGS) ×3 IMPLANT
GLOVE SURG SYN 9.0  PF PI (GLOVE) ×2
GLOVE SURG SYN 9.0 PF PI (GLOVE) ×1 IMPLANT
GOWN SRG 2XL LVL 4 RGLN SLV (GOWNS) ×1 IMPLANT
GOWN STRL NON-REIN 2XL LVL4 (GOWNS) ×2
GOWN STRL REUS W/ TWL LRG LVL3 (GOWN DISPOSABLE) ×1 IMPLANT
GOWN STRL REUS W/TWL LRG LVL3 (GOWN DISPOSABLE) ×2
KIT TURNOVER KIT A (KITS) ×3 IMPLANT
NS IRRIG 500ML POUR BTL (IV SOLUTION) ×3 IMPLANT
PACK EXTREMITY (MISCELLANEOUS) ×3 IMPLANT
PAD CAST CTTN 3X4 STRL (SOFTGOODS) ×1 IMPLANT
SCALPEL PROTECTED #15 DISP (BLADE) ×6 IMPLANT
SUT ETHILON 4-0 (SUTURE) ×2
SUT ETHILON 4-0 FS2 18XMFL BLK (SUTURE) ×1
SUT VIC AB 3-0 SH 27 (SUTURE) ×2
SUT VIC AB 3-0 SH 27X BRD (SUTURE) ×1 IMPLANT
SUTURE ETHLN 4-0 FS2 18XMF BLK (SUTURE) ×1 IMPLANT

## 2019-10-14 NOTE — Op Note (Signed)
10/14/2019  12:56 PM  PATIENT:  Brianna Mora  79 y.o. female  PRE-OPERATIVE DIAGNOSIS:  Tommi Rumps Quervain's tenosynovitis  POST-OPERATIVE DIAGNOSIS:  Tommi Rumps Quervain's tenosynovitis  PROCEDURE:  Procedure(s): RELEASE DORSAL COMPARTMENT (DEQUERVAIN) (Right)  SURGEON: Leitha Schuller, MD  ASSISTANTS: None  ANESTHESIA:   general  EBL:  Total I/O In: 500 [I.V.:500] Out: 0   BLOOD ADMINISTERED:none  DRAINS: none   LOCAL MEDICATIONS USED:  MARCAINE     SPECIMEN:  No Specimen  DISPOSITION OF SPECIMEN:  N/A  COUNTS:  YES  TOURNIQUET:   Total Tourniquet Time Documented: Upper Arm (Right) - 11 minutes Total: Upper Arm (Right) - 11 minutes   IMPLANTS: None  DICTATION: .Dragon Dictation patient was brought to the operating room and after adequate general anesthesia was obtained the right arm was prepped and draped in the usual sterile fashion.  A tourniquet had been applied to the upper arm and after appropriate patient identification and timeout procedures were completed tourniquet was raised.  Proximally 1 cm transverse incision was made over the radial styloid and the area of a knot caused by the extensor retinaculum.  This was quite thick and after was exposed it was incised longitudinally and with scissors dissection was carried out proximally and distally to get release of the entire Deak remains canal.  Great care was taken to preserve branches of the superficial radial nerve.  Distally there was the most compression but there was a additional septation more proximally which was also released.  When this was not entirely released and there is no pressure identified on the tendons the wound was infiltrated with 5 cc of half percent Marcaine without epinephrine and the wound was closed with 2 simple interrupted 4-0 nylon skin sutures.  Xeroform 4 x 4's web roll and Ace wrap applied tourniquet let down at the close of the case  PLAN OF CARE: Discharge to home after PACU  PATIENT  DISPOSITION:  PACU - hemodynamically stable.

## 2019-10-14 NOTE — Anesthesia Preprocedure Evaluation (Signed)
Anesthesia Evaluation  Patient identified by MRN, date of birth, ID band Patient awake    Reviewed: Allergy & Precautions, H&P , NPO status , Patient's Chart, lab work & pertinent test results, reviewed documented beta blocker date and time   Airway Mallampati: II  TM Distance: >3 FB Neck ROM: full    Dental  (+) Teeth Intact   Pulmonary neg pulmonary ROS, former smoker,    Pulmonary exam normal        Cardiovascular Exercise Tolerance: Good hypertension, On Medications negative cardio ROS Normal cardiovascular exam Rate:Normal     Neuro/Psych negative neurological ROS  negative psych ROS   GI/Hepatic negative GI ROS, Neg liver ROS,   Endo/Other  diabetesHypothyroidism   Renal/GU negative Renal ROS  negative genitourinary   Musculoskeletal   Abdominal   Peds  Hematology negative hematology ROS (+)   Anesthesia Other Findings   Reproductive/Obstetrics negative OB ROS                             Anesthesia Physical Anesthesia Plan  ASA: III  Anesthesia Plan: General LMA   Post-op Pain Management:    Induction:   PONV Risk Score and Plan:   Airway Management Planned:   Additional Equipment:   Intra-op Plan:   Post-operative Plan:   Informed Consent: I have reviewed the patients History and Physical, chart, labs and discussed the procedure including the risks, benefits and alternatives for the proposed anesthesia with the patient or authorized representative who has indicated his/her understanding and acceptance.       Plan Discussed with: CRNA  Anesthesia Plan Comments:         Anesthesia Quick Evaluation

## 2019-10-14 NOTE — Transfer of Care (Signed)
Immediate Anesthesia Transfer of Care Note  Patient: Brianna Mora  Procedure(s) Performed: RELEASE DORSAL COMPARTMENT (DEQUERVAIN) (Right Wrist)  Patient Location: PACU  Anesthesia Type:General  Level of Consciousness: sedated  Airway & Oxygen Therapy: Patient Spontanous Breathing and Patient connected to face mask oxygen  Post-op Assessment: Report given to RN and Post -op Vital signs reviewed and stable  Post vital signs: Reviewed and stable  Last Vitals:  Vitals Value Taken Time  BP 117/47 10/14/19 1254  Temp    Pulse 64 10/14/19 1302  Resp 10 10/14/19 1302  SpO2 99 % 10/14/19 1302  Vitals shown include unvalidated device data.  Last Pain:  Vitals:   10/14/19 1254  TempSrc:   PainSc: (P) Asleep         Complications: No apparent anesthesia complications

## 2019-10-14 NOTE — Anesthesia Procedure Notes (Signed)
Procedure Name: LMA Insertion Date/Time: 10/14/2019 12:22 PM Performed by: Almeta Monas, CRNA Pre-anesthesia Checklist: Patient identified, Patient being monitored, Timeout performed, Emergency Drugs available and Suction available Patient Re-evaluated:Patient Re-evaluated prior to induction Oxygen Delivery Method: Circle system utilized Preoxygenation: Pre-oxygenation with 100% oxygen Induction Type: IV induction Ventilation: Mask ventilation without difficulty LMA: LMA inserted LMA Size: 3.5 Tube type: Oral Number of attempts: 1 Placement Confirmation: positive ETCO2 and breath sounds checked- equal and bilateral Tube secured with: Tape Dental Injury: Teeth and Oropharynx as per pre-operative assessment

## 2019-10-14 NOTE — H&P (Signed)
Chief Complaint  Patient presents with  . Hand Pain  H & P RIGHT HAND   History of Present Illness:   Brianna Mora is a 79 y.o. female that presents to clinic today for her preoperative history and evaluation. Patient presents unaccompanied. The patient is scheduled to undergo a right de Quervain's release on 10/14/19 by Dr. Rudene Christians. The patient reports a long history of radial wrist pain. Patient was previously treated with a corticosteroid injection by Dr. Candelaria Stagers with only short-term relief. Mild thickening of the tendon was noted on ultrasound at that time. Patient denies any history of injury to the hand.   The patient's symptoms have progressed to the point that they decrease her quality of life. The patient has previously undergone conservative treatment including NSAIDS, activity modification, and corticosteroid injection without adequate control of her symptoms.  Past Medical, Surgical, Family, Social History, Allergies, Medications:   Past Medical History:  Past Medical History:  Diagnosis Date  . Diabetes mellitus type 2, uncomplicated (CMS-HCC)  not insulin dependent  . Hypertension  . Thyroid agenesis   Past Surgical History:  Past Surgical History:  Procedure Laterality Date  . BACK SURGERY  . CHOLECYSTECTOMY  . ENDOSCOPIC CARPAL TUNNEL RELEASE Right 03/08/2019  . EXTRACTION TEETH 04/01/2018  . HYSTERECTOMY   Current Medications:  Current Outpatient Medications  Medication Sig Dispense Refill  . acetaminophen (TYLENOL) 650 MG ER tablet Take 1,300 mg by mouth 2 (two) times daily as needed for Pain  . aspirin 81 MG EC tablet Take 81 mg by mouth once daily  . cholecalciferol (VITAMIN D3) 1000 unit tablet Take 1,000 Units by mouth once daily  . colchicine (COLCRYS) 0.6 mg tablet Take 2 tablets (1.2mg ) by mouth at first sign of gout flare followed by 1 tablet (0.6mg ) after 1 hour. (Max 1.8mg  within 1 hour) 15 tablet 0  . FUROsemide (LASIX) 20 MG tablet Take 1 tablet (20 mg  total) by mouth once daily as needed for lower extremity edema 90 tablet 1  . gabapentin (NEURONTIN) 100 MG capsule Take 1 capsule (100 mg total) by mouth 3 (three) times daily 270 capsule 1  . glimepiride (AMARYL) 2 MG tablet Take 1 tablet (2 mg total) by mouth daily with breakfast 90 tablet 1  . hydrALAZINE (APRESOLINE) 25 MG tablet Take 1 tablet (25 mg total) by mouth 2 (two) times daily 180 tablet 3  . levothyroxine (SYNTHROID) 125 MCG tablet Take 1 tablet (125 mcg total) by mouth once daily Take on an empty stomach with a glass of water at least 30-60 minutes before breakfast. 90 tablet 1  . losartan (COZAAR) 100 MG tablet TAKE 1 TABLET BY MOUTH EVERY DAY 90 tablet 0  . losartan (COZAAR) 25 MG tablet TAKE 1 TABLET BY MOUTH EVERY DAY 90 tablet 1  . melatonin 10 mg Tab Take 10 mg by mouth nightly  . metoprolol tartrate (LOPRESSOR) 50 MG tablet Take 1 tablet (50 mg total) by mouth 2 (two) times daily for 90 days 180 tablet 0  . multivitamin with minerals tablet Take 1 tablet by mouth once daily  . ondansetron (ZOFRAN-ODT) 4 MG disintegrating tablet Take 1 tablet (4 mg total) by mouth every 8 (eight) hours as needed for Nausea May take two if necessary. 20 tablet 0  . rosuvastatin (CRESTOR) 10 MG tablet TAKE 1 TABLET BY MOUTH EVERY DAY 90 tablet 1  . traZODone (DESYREL) 100 MG tablet TAKE 1 TABLET BY MOUTH EVERY DAY AT NIGHT 90 tablet 1  No current facility-administered medications for this visit.   Allergies:  Allergies  Allergen Reactions  . Penicillins Rash and Swelling   Social History:  Social History   Socioeconomic History  . Marital status: Widowed  Spouse name: Not on file  . Number of children: Not on file  . Years of education: Not on file  . Highest education level: Not on file  Occupational History  . Not on file  Tobacco Use  . Smoking status: Former Smoker  Types: Cigarettes  Quit date: 1993  Years since quitting: 28.3  . Smokeless tobacco: Never Used  Vaping  Use  . Vaping Use: Never used  Substance and Sexual Activity  . Alcohol use: Yes  . Drug use: Never  . Sexual activity: Defer  Other Topics Concern  . Not on file  Social History Narrative  . Not on file   Social Determinants of Health   Financial Resource Strain:  . Difficulty of Paying Living Expenses:  Food Insecurity:  . Worried About Programme researcher, broadcasting/film/video in the Last Year:  . Barista in the Last Year:  Transportation Needs:  . Freight forwarder (Medical):  Marland Kitchen Lack of Transportation (Non-Medical):  Physical Activity:  . Days of Exercise per Week:  . Minutes of Exercise per Session:  Stress:  . Feeling of Stress :  Social Connections:  . Frequency of Communication with Friends and Family:  . Frequency of Social Gatherings with Friends and Family:  . Attends Religious Services:  . Active Member of Clubs or Organizations:  . Attends Banker Meetings:  Marland Kitchen Marital Status:   Family History:  Family History  Problem Relation Age of Onset  . Stroke Mother  . Cancer Father  . Cancer Sister  . Diabetes type II Brother  . Parkinsonism Brother   Review of Systems:   A 10+ ROS was performed, reviewed, and the pertinent orthopaedic findings are documented in the HPI.   Physical Examination:   BP 140/80  Ht 165.1 cm (5\' 5" )  Wt 80.7 kg (178 lb)  LMP (LMP Unknown)  BMI 29.62 kg/m   Patient is a well-developed, well-nourished female in no acute distress. Patient has normal mood and affect. Patient is alert and oriented to person, place, and time.   HEENT: Atraumatic, normocephalic. Pupils equal and reactive to light. Extraocular motion intact. Noninjected sclera.  Cardiovascular: Regular rate and rhythm, with no murmurs, rubs, or gallops. Distal pulses palpable.  Respiratory: Lungs clear to auscultation bilaterally.   Examination of the right hand reveals skin that is clean and dry. Patient tender palpation over the radial styloid. Full range  of motion of the wrist and fingers noted. Sensation intact of the median, radial, and ulnar nerve distributions. Radial pulse 2+  Tests Performed/Reviewed:  X-rays  No new radiographs were obtained today.   Impression:   ICD-10-CM  1. De Quervain's tenosynovitis M65.4  2. Right wrist pain M25.531   Plan:   -De Quervain's tenosynovitis, right The patient will benefit from a right de Quervain's release. The risks of surgery, including infection and blood clots, were discussed with the patient. Having failed conservative treatment, the patient has elected to proceed with a de Quervain's release with Dr. . The risks of surgery, including blood clots and infection, were discussed with the patient. Measures taken to reduce these risks were also discussed with the patient. The postoperative course was discussed with the patient. Patient was instructed to stop all blood thinners prior  to surgery. Patient understands and elects to proceed with surgery.  Contact our office with any questions or concerns. Follow up as indicated, or sooner should any new problems arise, if conditions worsen, or if they are otherwise concerned.   Michelene Gardener, PA-C [facility] [address] [place], [place] [address] Phone: [phone]  This note was generated in part with voice recognition software and I apologize for any typographical errors that were not detected and corrected.   Electronically signed by Michelene Gardener, PA at 10/12/2019 1:18 PM EDT   Reviewed paper H+P, will be scanned into chart. No changes noted.

## 2019-10-14 NOTE — Discharge Instructions (Addendum)
Loosen Ace wrap prior to discharge and if fingers swell during the weekend. Leave cotton roll underneath alone. Keep dressing clean and dry. Work on finger motion is much as you want. Pain medicine as directed.  AMBULATORY SURGERY  DISCHARGE INSTRUCTIONS   1) The drugs that you were given will stay in your system until tomorrow so for the next 24 hours you should not:  A) Drive an automobile B) Make any legal decisions C) Drink any alcoholic beverage   2) You may resume regular meals tomorrow.  Today it is better to start with liquids and gradually work up to solid foods.  You may eat anything you prefer, but it is better to start with liquids, then soup and crackers, and gradually work up to solid foods.   3) Please notify your doctor immediately if you have any unusual bleeding, trouble breathing, redness and pain at the surgery site, drainage, fever, or pain not relieved by medication.    4) Additional Instructions:        Please contact your physician with any problems or Same Day Surgery at [phone], Monday through Friday 6 am to 4 pm, or [place] at [facility] number at [phone].

## 2019-10-18 ENCOUNTER — Other Ambulatory Visit: Payer: Self-pay

## 2019-10-18 ENCOUNTER — Encounter: Payer: Self-pay | Admitting: Emergency Medicine

## 2019-10-18 DIAGNOSIS — I10 Essential (primary) hypertension: Secondary | ICD-10-CM | POA: Diagnosis present

## 2019-10-18 DIAGNOSIS — Z5321 Procedure and treatment not carried out due to patient leaving prior to being seen by health care provider: Secondary | ICD-10-CM | POA: Insufficient documentation

## 2019-10-18 DIAGNOSIS — R519 Headache, unspecified: Secondary | ICD-10-CM | POA: Insufficient documentation

## 2019-10-18 LAB — BASIC METABOLIC PANEL
Anion gap: 8 (ref 5–15)
BUN: 23 mg/dL (ref 8–23)
CO2: 28 mmol/L (ref 22–32)
Calcium: 10.1 mg/dL (ref 8.9–10.3)
Chloride: 104 mmol/L (ref 98–111)
Creatinine, Ser: 1.17 mg/dL — ABNORMAL HIGH (ref 0.44–1.00)
GFR calc Af Amer: 52 mL/min — ABNORMAL LOW (ref >60)
GFR calc non Af Amer: 45 mL/min — ABNORMAL LOW (ref >60)
Glucose, Bld: 160 mg/dL — ABNORMAL HIGH (ref 70–99)
Potassium: 3.8 mmol/L (ref 3.5–5.1)
Sodium: 140 mmol/L (ref 135–145)

## 2019-10-18 LAB — CBC
HCT: 40.2 % (ref 36.0–46.0)
Hemoglobin: 13.8 g/dL (ref 12.0–15.0)
MCH: 30.7 pg (ref 26.0–34.0)
MCHC: 34.3 g/dL (ref 30.0–36.0)
MCV: 89.3 fL (ref 80.0–100.0)
Platelets: 151 10*3/uL (ref 150–400)
RBC: 4.5 MIL/uL (ref 3.87–5.11)
RDW: 11.9 % (ref 11.5–15.5)
WBC: 8.8 10*3/uL (ref 4.0–10.5)
nRBC: 0 % (ref 0.0–0.2)

## 2019-10-18 LAB — TROPONIN I (HIGH SENSITIVITY): Troponin I (High Sensitivity): 9 ng/L (ref <18)

## 2019-10-18 NOTE — ED Triage Notes (Signed)
Pt to ED from home c/o hypertension today.  States was seen at orthopedist today and had high BP.  States hx of HTN and taking medications regularly, headaches today since 1100.  Denies CP or SOB.  States took extra BP medication today.  Pt A&Ox4, skin WNL, chest rise even and unlabored, in NAD at this time.

## 2019-10-18 NOTE — Anesthesia Postprocedure Evaluation (Signed)
Anesthesia Post Note  Patient: Brianna Mora  Procedure(s) Performed: RELEASE DORSAL COMPARTMENT (DEQUERVAIN) (Right Wrist)  Patient location during evaluation: PACU Anesthesia Type: General Level of consciousness: awake and alert Pain management: pain level controlled Vital Signs Assessment: post-procedure vital signs reviewed and stable Respiratory status: spontaneous breathing, nonlabored ventilation, respiratory function stable and patient connected to nasal cannula oxygen Cardiovascular status: blood pressure returned to baseline and stable Postop Assessment: no apparent nausea or vomiting Anesthetic complications: no     Last Vitals:  Vitals:   10/14/19 1427 10/14/19 1433  BP: (!) 166/61 (!) (P) 149/58  Pulse: 65 (P) 62  Resp: 16   Temp: (!) 36.3 C   SpO2: 98% (P) 99%    Last Pain:  Vitals:   10/15/19 0842  TempSrc:   PainSc: 0-No pain                 Yevette Edwards

## 2019-10-18 NOTE — ED Notes (Signed)
Only bmp and cbc per Dr. Derrill Kay.

## 2019-10-19 ENCOUNTER — Emergency Department
Admission: EM | Admit: 2019-10-19 | Discharge: 2019-10-19 | Disposition: A | Discharge status: Home / Self Care | Payer: Medicare Other | Attending: Emergency Medicine | Admitting: Emergency Medicine

## 2019-12-28 ENCOUNTER — Other Ambulatory Visit: Payer: Self-pay

## 2019-12-28 ENCOUNTER — Emergency Department: Payer: Medicare Other

## 2019-12-28 ENCOUNTER — Emergency Department
Admission: EM | Admit: 2019-12-28 | Discharge: 2019-12-28 | Disposition: A | Discharge status: Home / Self Care | Payer: Medicare Other | Attending: Emergency Medicine | Admitting: Emergency Medicine

## 2019-12-28 DIAGNOSIS — I129 Hypertensive chronic kidney disease with stage 1 through stage 4 chronic kidney disease, or unspecified chronic kidney disease: Secondary | ICD-10-CM | POA: Insufficient documentation

## 2019-12-28 DIAGNOSIS — E039 Hypothyroidism, unspecified: Secondary | ICD-10-CM | POA: Diagnosis not present

## 2019-12-28 DIAGNOSIS — R519 Headache, unspecified: Secondary | ICD-10-CM

## 2019-12-28 DIAGNOSIS — Z79899 Other long term (current) drug therapy: Secondary | ICD-10-CM | POA: Insufficient documentation

## 2019-12-28 DIAGNOSIS — N189 Chronic kidney disease, unspecified: Secondary | ICD-10-CM | POA: Diagnosis not present

## 2019-12-28 DIAGNOSIS — Z87891 Personal history of nicotine dependence: Secondary | ICD-10-CM | POA: Insufficient documentation

## 2019-12-28 DIAGNOSIS — R030 Elevated blood-pressure reading, without diagnosis of hypertension: Secondary | ICD-10-CM | POA: Diagnosis present

## 2019-12-28 DIAGNOSIS — I10 Essential (primary) hypertension: Secondary | ICD-10-CM

## 2019-12-28 DIAGNOSIS — E119 Type 2 diabetes mellitus without complications: Secondary | ICD-10-CM | POA: Diagnosis not present

## 2019-12-28 LAB — BASIC METABOLIC PANEL
Anion gap: 10 (ref 5–15)
BUN: 28 mg/dL — ABNORMAL HIGH (ref 8–23)
CO2: 27 mmol/L (ref 22–32)
Calcium: 10.1 mg/dL (ref 8.9–10.3)
Chloride: 102 mmol/L (ref 98–111)
Creatinine, Ser: 1.25 mg/dL — ABNORMAL HIGH (ref 0.44–1.00)
GFR calc Af Amer: 48 mL/min — ABNORMAL LOW (ref >60)
GFR calc non Af Amer: 41 mL/min — ABNORMAL LOW (ref >60)
Glucose, Bld: 124 mg/dL — ABNORMAL HIGH (ref 70–99)
Potassium: 4.1 mmol/L (ref 3.5–5.1)
Sodium: 139 mmol/L (ref 135–145)

## 2019-12-28 LAB — CBC
HCT: 40.9 % (ref 36.0–46.0)
Hemoglobin: 13.7 g/dL (ref 12.0–15.0)
MCH: 30.7 pg (ref 26.0–34.0)
MCHC: 33.5 g/dL (ref 30.0–36.0)
MCV: 91.7 fL (ref 80.0–100.0)
Platelets: 169 10*3/uL (ref 150–400)
RBC: 4.46 MIL/uL (ref 3.87–5.11)
RDW: 12 % (ref 11.5–15.5)
WBC: 8.6 10*3/uL (ref 4.0–10.5)
nRBC: 0 % (ref 0.0–0.2)

## 2019-12-28 MED ORDER — ACETAMINOPHEN 500 MG PO TABS
1000.0000 mg | ORAL_TABLET | Freq: Once | ORAL | Status: AC
  Administered 2019-12-28: 1000 mg via ORAL
  Filled 2019-12-28: qty 2

## 2019-12-28 MED ORDER — HYDRALAZINE HCL 50 MG PO TABS
25.0000 mg | ORAL_TABLET | Freq: Once | ORAL | Status: AC
  Administered 2019-12-28: 25 mg via ORAL
  Filled 2019-12-28: qty 1

## 2019-12-28 NOTE — Discharge Instructions (Addendum)
Take your blood pressure once in the morning and once at nighttime.  You can take your hydralazine 25 mg once in the morning and once at nighttime.  If your blood pressures on either check is below 120 I would not take the hydralazine.  Keep a record of this for 1 week.  Follow-up with your primary care doctor and see if they want to adjust her hydralazine even more depending upon what your blood pressures are doing.  You take Tylenol 1 g every 8 hours to help with your headache  Return to the ER if you develop worsening head pain, chest pain, shortness of breath, abdominal pain or any other concerns    IMPRESSION: Mild diffuse cortical atrophy. Mild chronic ischemic white matter disease. No acute intracranial abnormality seen.

## 2019-12-28 NOTE — ED Provider Notes (Signed)
[facility] Emergency Department Provider Note  ____________________________________________   First MD Initiated Contact with Patient 12/28/19 1940     (approximate)  I have reviewed the triage vital signs and the nursing notes.   HISTORY  Chief Complaint Hypertension    HPI Brianna Mora is a 79 y.o. female with diabetes, hypertension, CKD who comes in for elevated blood pressures.  Patient reports she is on 3 blood pressure medication and was taken off hydralazine 25 twice daily after being started on an ARB.  Her blood pressures medications are below.  She reports having some high blood pressures into the 190s to 200s.  She took her dose of hydralazine that she used to be on and her blood pressure was still elevated so she want to come to the ER to be evaluated.  She does report a headache that is on either side of her temples going up into her forehead, 8 out of 10, severe, constant, gradually worsening throughout the day, has not take anything to help it, nothing makes it worse.  She denies it being sudden onset or severe in nature.  Denies blurry vision.  Denies any chest pain, shortness of breath, urinary symptoms  olmesartan 40 daily Metoprolol 50 BID Furosemide 20 daily             Past Medical History:  Diagnosis Date  . Arthritis   . Chronic kidney disease   . Diabetes mellitus without complication (HCC)   . Hypertension   . Hypothyroidism   . MS (multiple sclerosis) (HCC)     There are no problems to display for this patient.   Past Surgical History:  Procedure Laterality Date  . ABDOMINAL HYSTERECTOMY    . BACK SURGERY    . BREAST EXCISIONAL BIOPSY Left   . CHOLECYSTECTOMY    . COLOSTOMY    . DORSAL COMPARTMENT RELEASE Right 10/14/2019   Procedure: RELEASE DORSAL COMPARTMENT (DEQUERVAIN);  Surgeon: Kennedy Bucker, MD;  Location: [facility] ORS;  Service: Orthopedics;  Laterality: Right;    Prior to Admission medications     Medication Sig Start Date End Date Taking? Authorizing Provider  aspirin EC 81 MG tablet Take 81 mg by mouth daily.    [provider]  cholecalciferol (VITAMIN D3) 25 MCG (1000 UNIT) tablet Take 1,000 Units by mouth daily.    [provider]  furosemide (LASIX) 20 MG tablet Take 20 mg by mouth daily.  11/25/17   [provider]  gabapentin (NEURONTIN) 100 MG capsule Take 100 mg by mouth 2 (two) times daily.     [provider]  glimepiride (AMARYL) 2 MG tablet Take 2 mg by mouth daily with breakfast.  11/13/17   [provider]  hydrALAZINE (APRESOLINE) 25 MG tablet Take 25 mg by mouth in the morning and at bedtime.  12/08/17   [provider]  HYDROcodone-acetaminophen (NORCO) 5-325 MG tablet Take 1 tablet by mouth every 6 (six) hours as needed. 10/14/19   Kennedy Bucker, MD  levothyroxine (SYNTHROID) 125 MCG tablet Take 125 mcg by mouth daily before breakfast.  11/17/17   [provider]  losartan (COZAAR) 25 MG tablet Take 25 mg by mouth at bedtime.  11/12/17   [provider]  metoprolol tartrate (LOPRESSOR) 50 MG tablet Take 50 mg by mouth 2 (two) times daily. 11/26/17   [provider]  Multiple Vitamin (MULTIVITAMIN WITH MINERALS) TABS tablet Take 1 tablet by mouth daily.    [provider]  rosuvastatin (CRESTOR) 10 MG tablet Take 10 mg by mouth at bedtime.  12/20/17   [provider]  traMADol (ULTRAM) 50 MG tablet Take 50 mg by mouth 2 (two) times daily.  10/02/17   [provider]  traZODone (DESYREL) 100 MG tablet Take 100 mg by mouth at bedtime.  12/23/17   [provider]    Allergies Penicillins  Family History  Problem Relation Age of Onset  . Breast cancer Neg Hx     Social History Social History   Tobacco Use  . Smoking status: Former Games developer  . Smokeless tobacco: Never Used  Vaping Use  . Vaping Use: Never used  Substance Use Topics  . Alcohol use: No  . Drug  use: Never      Review of Systems Constitutional: No fever/chills Eyes: No visual changes. ENT: No sore throat. Cardiovascular: Denies chest pain. Respiratory: Denies shortness of breath. Gastrointestinal: No abdominal pain.  No nausea, no vomiting.  No diarrhea.  No constipation. Genitourinary: Negative for dysuria. Musculoskeletal: Negative for back pain. Skin: Negative for rash. Neurological: Positive headache, no focal weakness or numbness. All other ROS negative ____________________________________________   PHYSICAL EXAM:  VITAL SIGNS: ED Triage Vitals  Enc Vitals Group     BP 12/28/19 1724 (!) 194/76     Pulse Rate 12/28/19 1724 74     Resp 12/28/19 1724 18     Temp 12/28/19 1724 98.3 F (36.8 C)     Temp Source 12/28/19 1724 Oral     SpO2 12/28/19 1724 100 %     Weight 12/28/19 1724 177 lb (80.3 kg)     Height 12/28/19 1724 5' 5.5" (1.664 m)     Head Circumference --      Peak Flow --      Pain Score 12/28/19 1731 8     Pain Loc --      Pain Edu? --      Excl. in GC? --     Constitutional: Alert and oriented. Well appearing and in no acute distress. Eyes: Conjunctivae are normal. EOMI. Head: Atraumatic. Nose: No congestion/rhinnorhea. Mouth/Throat: Mucous membranes are moist.   Neck: No stridor. Trachea Midline. FROM Cardiovascular: Normal rate, regular rhythm. Grossly normal heart sounds.  Good peripheral circulation. Respiratory: Normal respiratory effort.  No retractions. Lungs CTAB. Gastrointestinal: Soft and nontender. No distention. No abdominal bruits.  Musculoskeletal: No lower extremity tenderness nor edema.  No joint effusions. Neurologic:  Normal speech and language. No gross focal neurologic deficits are appreciated.  Cranial nerves II through XII are intact.  Equal strength in arms and legs. Skin:  Skin is warm, dry and intact. No rash noted. Psychiatric: Mood and affect are normal. Speech and behavior are normal. GU: Deferred    ____________________________________________   LABS (all labs ordered are listed, but only abnormal results are displayed)  Labs Reviewed  BASIC METABOLIC PANEL - Abnormal; Notable for the following components:      Result Value   Glucose, Bld 124 (*)    BUN 28 (*)    Creatinine, Ser 1.25 (*)    GFR calc non Af Amer 41 (*)    GFR calc Af Amer 48 (*)    All other components within normal limits  CBC   ____________________________________________   RADIOLOGY   Official radiology report(s): CT Head Wo Contrast  Result Date: 12/28/2019 CLINICAL DATA:  Headache. EXAM: CT HEAD WITHOUT CONTRAST TECHNIQUE: Contiguous axial images were obtained from the base of the skull through  the vertex without intravenous contrast. COMPARISON:  December 30, 2017. FINDINGS: Brain: Mild diffuse cortical atrophy is noted. Mild chronic ischemic white matter disease is noted. No mass effect or midline shift is noted. Ventricular size is within normal limits. There is no evidence of mass lesion, hemorrhage or acute infarction. Vascular: No hyperdense vessel or unexpected calcification. Skull: Normal. Negative for fracture or focal lesion. Sinuses/Orbits: No acute finding. Other: None. IMPRESSION: Mild diffuse cortical atrophy. Mild chronic ischemic white matter disease. No acute intracranial abnormality seen. Electronically Signed   By: Lupita Raider M.D.   On: 12/28/2019 19:50    ____________________________________________   PROCEDURES  Procedure(s) performed (including Critical Care):  Procedures   ____________________________________________   INITIAL IMPRESSION / ASSESSMENT AND PLAN / ED COURSE  Brianna Mora was evaluated in Emergency Department on 12/28/2019 for the symptoms described in the history of present illness. She was evaluated in the context of the global COVID-19 pandemic, which necessitated consideration that the patient might be at risk for infection with the SARS-CoV-2 virus that  causes COVID-19. Institutional protocols and algorithms that pertain to the evaluation of patients at risk for COVID-19 are in a state of rapid change based on information released by regulatory bodies including the CDC and federal and state organizations. These policies and algorithms were followed during the patient's care in the ED.     Patient is a well-appearing 79 year old with normal neuro exam who comes in hypertensive with concerns for headaches.  CT head was ordered evaluate for intracranial hemorrhage.  Patient neurological exam is completely normal, and a stroke scale of 0.  Low suspicion for ischemic stroke.  her headache was not sudden and severe in onset to suggest subarachnoid.  She denies any blurry vision to suggest pseudotumor cerebri, cavernous venous thrombosis.  Labs were ordered in triage to evaluate for AKI.  No chest pain to suggest ACS, dissection.  No abdominal pain to suggest AAA rupture  CT imaging was negative for bleed.  Some chronic changes.  Copy of report was given to patient.  Labs are reassuring with her kidney function around baseline at 1.25  Patient was given a dose of her hydralazine that she was recently taken off of as well as Tylenol.  Her repeat blood pressure is 157/62.  Patient reports her headache is much more mild at this time and she feels comfortable going home.  We discussed checking her blood pressures once in the morning once at nighttime and restarting the hydralazine twice a day.  She is going to record her blood pressures and follow-up with her primary care doctor in 1 week to see if she needs to have this further adjusted.  We discussed that if she develops any other new symptoms with the headaches that she can return to the ER for further work-up.    ____________________________________________   FINAL CLINICAL IMPRESSION(S) / ED DIAGNOSES   Final diagnoses:  Hypertension, unspecified type  Intractable headache, unspecified chronicity  pattern, unspecified headache type      MEDICATIONS GIVEN DURING THIS VISIT:  Medications  acetaminophen (TYLENOL) tablet 1,000 mg (1,000 mg Oral Given 12/28/19 2009)  hydrALAZINE (APRESOLINE) tablet 25 mg (25 mg Oral Given 12/28/19 2009)     ED Discharge Orders    None       Note:  This document was prepared using Dragon voice recognition software and may include unintentional dictation errors.   Concha Se, MD 12/28/19 2100

## 2019-12-28 NOTE — ED Triage Notes (Addendum)
Pt comes via POV from home with c/o hypertension. Pt states she was at home and checked her BP. Pt states earlier she spoke with PCP and he advised her to take the BP medication he had  Prescribed before she was started on the Metoprolol.  Pt states she took the medication and then had a headache still. Pt states she called and PCP advised her to come here.  Pt denies any blurry vision. Pt states headache 8/10.

## 2019-12-28 NOTE — ED Notes (Signed)
Patient transported to CT 

## 2020-03-29 ENCOUNTER — Other Ambulatory Visit: Payer: Self-pay | Admitting: Internal Medicine

## 2020-03-29 DIAGNOSIS — Z1231 Encounter for screening mammogram for malignant neoplasm of breast: Secondary | ICD-10-CM

## 2021-11-22 IMAGING — CT CT HEAD W/O CM
3 series · 15 of 47 positions shown, 18 images · non-contrast
Comparison: December 30, 2017.

CLINICAL DATA: Headache.

EXAM:
CT HEAD WITHOUT CONTRAST
TECHNIQUE: Contiguous axial images were obtained from the base of the skull
through the vertex without intravenous contrast.

[Series 2: head wo · axial · 0.43mm/px · z∈[+239,+364]mm · 9 of 31 slices shown, 12 images]
[im 3/31  brain]
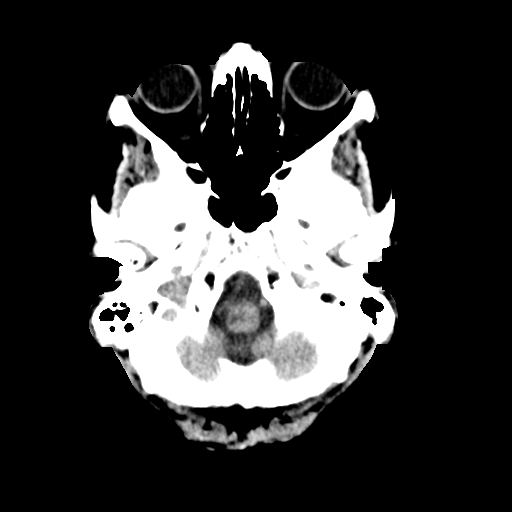
[im 3/31  bone]
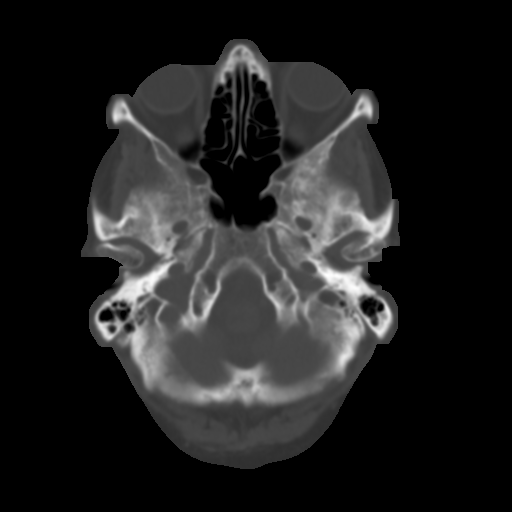
[im 6/31  brain]
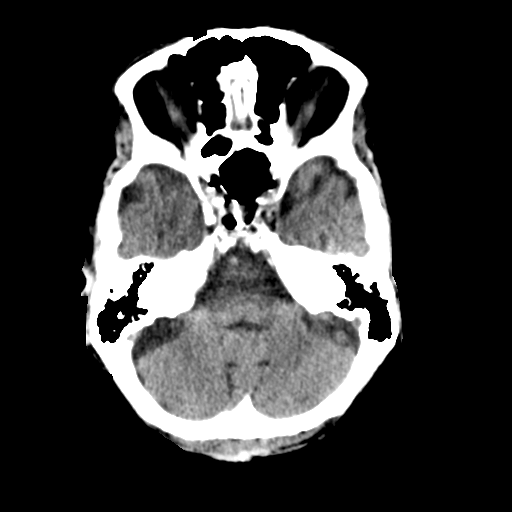
[im 9/31  brain]
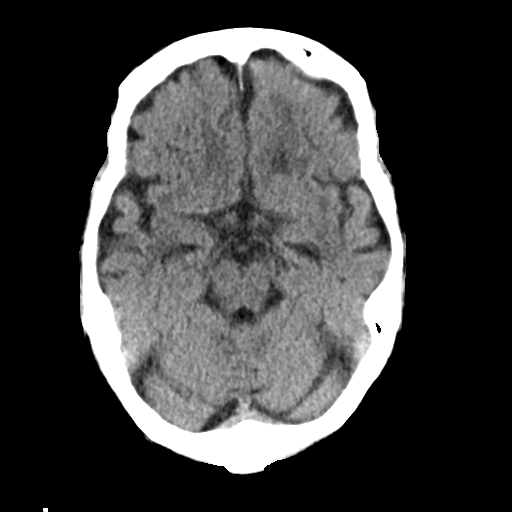
[im 12/31  brain]
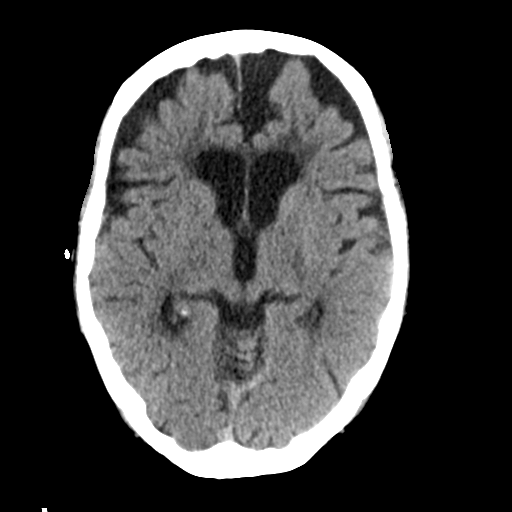
[im 16/31  brain]
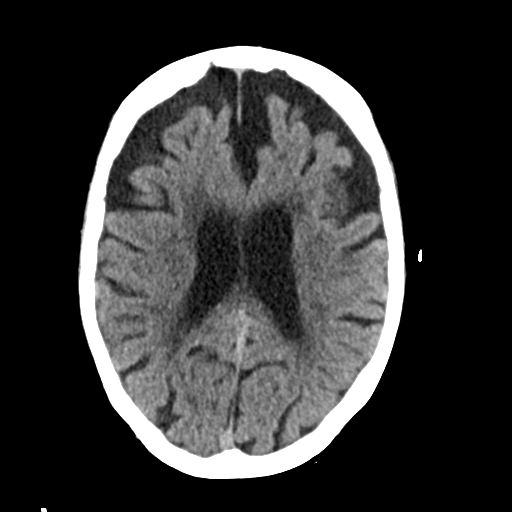
[im 16/31  bone]
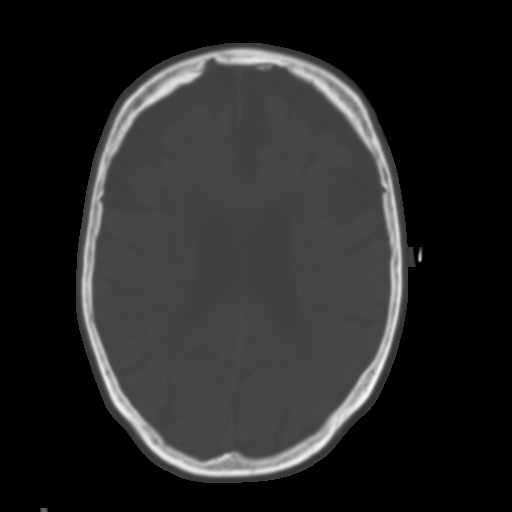
[im 19/31  brain]
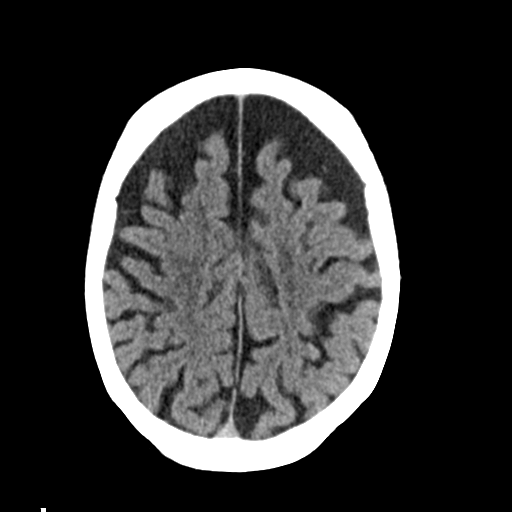
[im 22/31  brain]
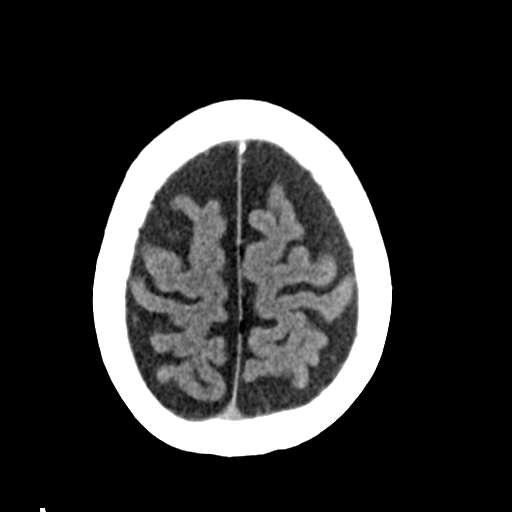
[im 25/31  brain]
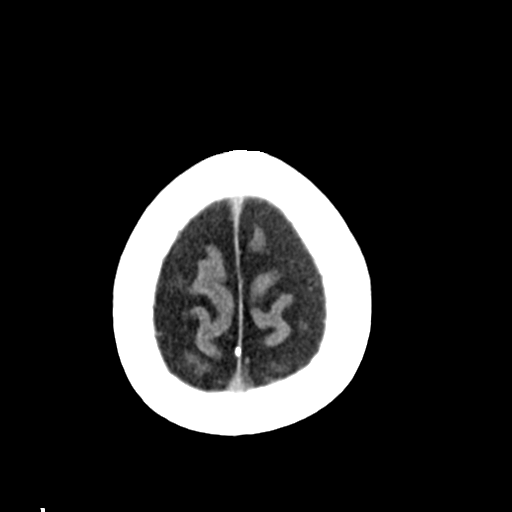
[im 28/31  brain]
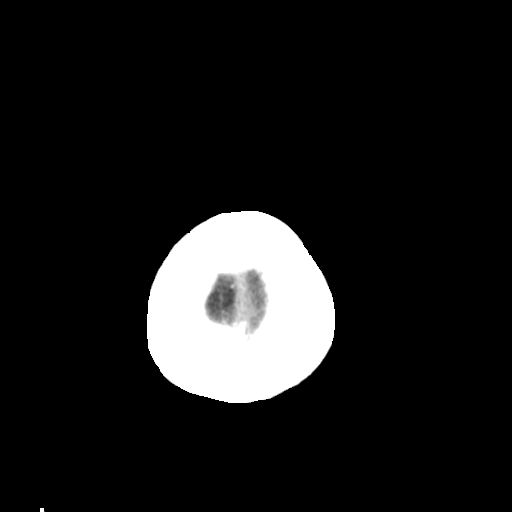
[im 28/31  bone]
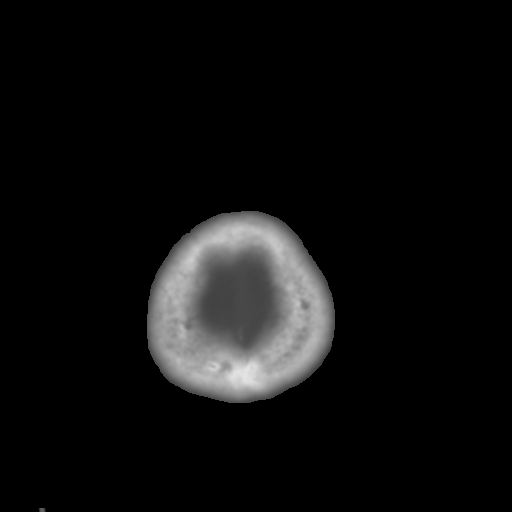

[Series 4: coronal soft tissue · coronal · 0.31mm/px · 3 of 67 slices shown]
[im 23/67  brain]
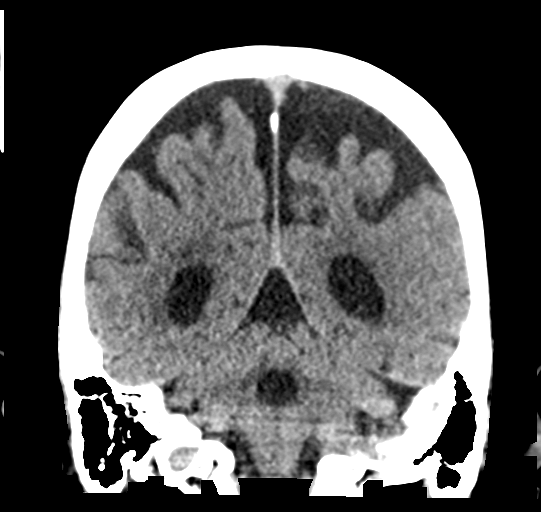
[im 30/67  brain]
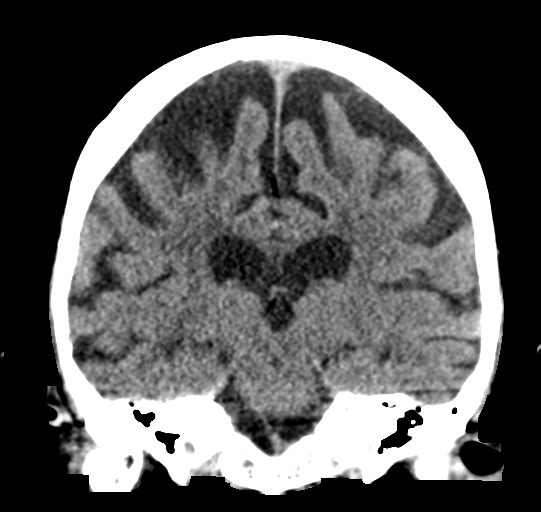
[im 37/67  brain]
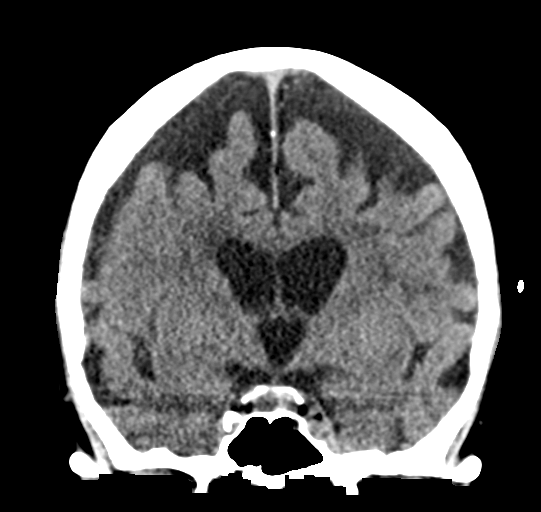

[Series 5: sagittal soft tissue · sagittal · 0.31mm/px · 3 of 49 slices shown]
[im 17/49  brain]
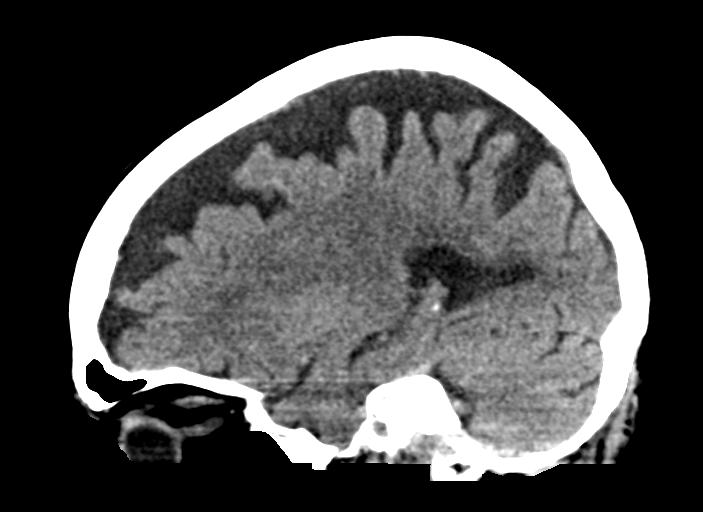
[im 25/49  brain]
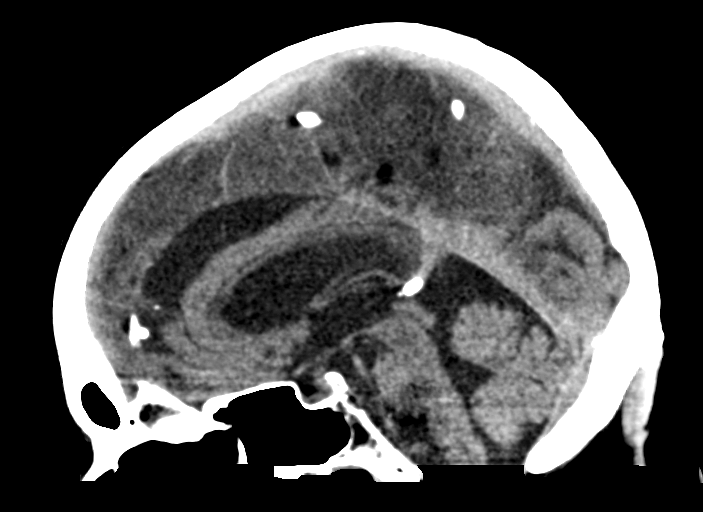
[im 33/49  brain]
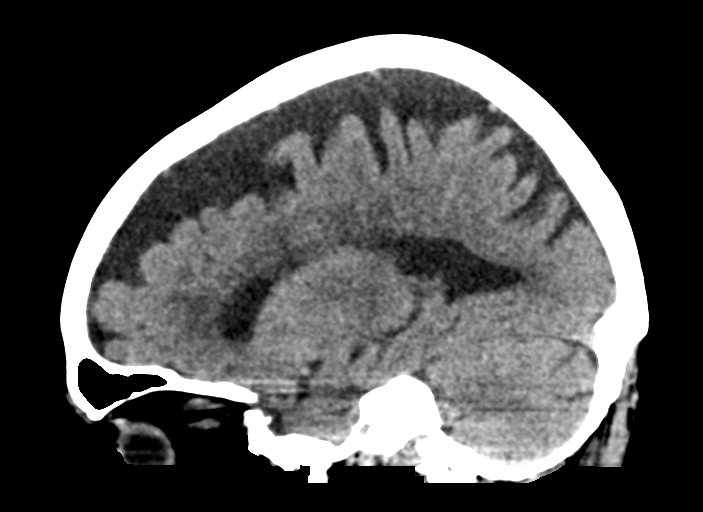

[15 of 47 positions shown; findings below may reference images not displayed]

FINDINGS: Brain: Mild diffuse cortical atrophy is noted. Mild chronic ischemic
white matter disease is noted. No mass effect or midline shift is
noted. Ventricular size is within normal limits. There is no
evidence of mass lesion, hemorrhage or acute infarction.

Vascular: No hyperdense vessel or unexpected calcification.

Skull: Normal. Negative for fracture or focal lesion.

Sinuses/Orbits: No acute finding.

Other: None.
IMPRESSION: Mild diffuse cortical atrophy. Mild chronic ischemic white matter
disease. No acute intracranial abnormality seen.
